# Patient Record
Sex: Male | Born: 1965 | Race: White | Hispanic: No | Marital: Single | Smoking: Current every day smoker
Health system: Southern US, Community
[De-identification: ages and names within clinical notes are randomized; demographics above are authoritative.]

## PROBLEM LIST (undated history)

## (undated) DIAGNOSIS — G4730 Sleep apnea, unspecified: Secondary | ICD-10-CM

## (undated) DIAGNOSIS — Z9689 Presence of other specified functional implants: Secondary | ICD-10-CM

## (undated) DIAGNOSIS — M1990 Unspecified osteoarthritis, unspecified site: Secondary | ICD-10-CM

## (undated) DIAGNOSIS — F32A Depression, unspecified: Secondary | ICD-10-CM

## (undated) DIAGNOSIS — M792 Neuralgia and neuritis, unspecified: Secondary | ICD-10-CM

## (undated) DIAGNOSIS — I10 Essential (primary) hypertension: Secondary | ICD-10-CM

## (undated) DIAGNOSIS — G8929 Other chronic pain: Secondary | ICD-10-CM

## (undated) DIAGNOSIS — E039 Hypothyroidism, unspecified: Secondary | ICD-10-CM

## (undated) DIAGNOSIS — R091 Pleurisy: Secondary | ICD-10-CM

## (undated) DIAGNOSIS — F419 Anxiety disorder, unspecified: Secondary | ICD-10-CM

## (undated) DIAGNOSIS — K219 Gastro-esophageal reflux disease without esophagitis: Secondary | ICD-10-CM

## (undated) DIAGNOSIS — E119 Type 2 diabetes mellitus without complications: Secondary | ICD-10-CM

## (undated) DIAGNOSIS — E785 Hyperlipidemia, unspecified: Secondary | ICD-10-CM

## (undated) DIAGNOSIS — Z972 Presence of dental prosthetic device (complete) (partial): Secondary | ICD-10-CM

## (undated) DIAGNOSIS — K08109 Complete loss of teeth, unspecified cause, unspecified class: Secondary | ICD-10-CM

## (undated) HISTORY — PX: KNEE ARTHROSCOPY: SHX127

---

## 1998-04-11 HISTORY — PX: UVULOPALATOPHARYNGOPLASTY: SHX827

## 2003-04-12 HISTORY — PX: ANKLE ARTHROPLASTY: SUR68

## 2003-04-12 HISTORY — PX: I&D EXTREMITY: SHX5045

## 2004-04-01 ENCOUNTER — Inpatient Hospital Stay (HOSPITAL_COMMUNITY): Admission: RE | Admit: 2004-04-01 | Discharge: 2004-04-03 | Payer: Self-pay | Admitting: Orthopedic Surgery

## 2006-04-11 HISTORY — PX: SPINAL CORD STIMULATOR IMPLANT: SHX2422

## 2013-04-11 HISTORY — PX: MULTIPLE TOOTH EXTRACTIONS: SHX2053

## 2014-03-31 ENCOUNTER — Other Ambulatory Visit: Payer: Self-pay | Admitting: Orthopedic Surgery

## 2014-04-09 ENCOUNTER — Encounter (HOSPITAL_BASED_OUTPATIENT_CLINIC_OR_DEPARTMENT_OTHER): Payer: Self-pay | Admitting: *Deleted

## 2014-04-09 NOTE — Progress Notes (Signed)
   04/09/14 0959  OBSTRUCTIVE SLEEP APNEA  Have you ever been diagnosed with sleep apnea through a sleep study? Yes  If yes, do you have and use a CPAP or BPAP machine every night? 0 (had corrective surgery)  Do you snore loudly (loud enough to be heard through closed doors)?  0  Do you often feel tired, fatigued, or sleepy during the daytime? 0  Has anyone observed you stop breathing during your sleep? 0  Do you have, or are you being treated for high blood pressure? 1  BMI more than 35 kg/m2? 1  Age over 46 years old? 0  Neck circumference greater than 40 cm/16 inches? 1  Gender: 1  Obstructive Sleep Apnea Score 4  Score 4 or greater  Results sent to PCP

## 2014-04-09 NOTE — Progress Notes (Signed)
Pt had corrective sleep apnea, denies snoring. Called for recent ekg, labs,chronic pain-to bring all meds,may need istat

## 2014-04-10 NOTE — H&P (Signed)
Adam [name] is an 48 y.o. male.   Chief Complaint:  Left knee Pain  HPI: Adam [name] is here today for followup on left knee pain.  This patient was last seen on 03/12/14 and that time he was diagnosed with internal derangement and the patient received a cortisone injection.  Patient is unable to have an MRI due to a spinal stimulator similar using this as a diagnostic injection.  The patient states that the injection did provide substantial immediate relief.  He did notice continued pressure in the knee.  The knee did fill week and loose.  He continues noticed cracking and grinding in the knee has locked up several times.  The cortisone taken and he had some good early for a couple days and then his symptoms returned.  He denies any new injury.  He denies any fevers chills night sweats or other signs of infection.  Past Medical History  Diagnosis Date  . Hypertension   . Diabetes mellitus without complication   . Sleep apnea     hx-had corrective surgery  . Arthritis   . GERD (gastroesophageal reflux disease)   . Hyperlipemia   . Hypothyroidism   . Chronic pain   . Peripheral neuropathic pain   . Full dentures   . Spinal cord stimulator status     Past Surgical History  Procedure Laterality Date  . Uvulopalatopharyngoplasty  2000  . Ankle arthroplasty  2005    right ankle injury  . I&d extremity  2005    right ankle  . Spinal cord stimulator implant  2008  . Spinal cord stimulator implant  2008    revision  . Knee arthroscopy      left x2  . Multiple tooth extractions  2015    History reviewed. No pertinent family history. Social History:  reports that he has been smoking.  He does not have any smokeless tobacco history on file. He reports that he drinks alcohol. He reports that he does not use illicit drugs.  Allergies: No Known Allergies  No prescriptions prior to admission    No results found for this or any previous visit (from the past 48 hour(s)). No results  found.  Review of Systems  Constitutional: Positive for malaise/fatigue.  HENT: Negative.   Eyes: Negative.   Cardiovascular: Positive for leg swelling.  Gastrointestinal: Positive for constipation.       Bowel changes  Musculoskeletal: Positive for myalgias and joint pain.  Skin: Negative.   Neurological: Negative.   Endo/Heme/Allergies: Negative.   Psychiatric/Behavioral: Negative.     Height 6\' 3"  (1.905 m), weight 151.501 kg (334 lb). Physical Exam  Constitutional: He is oriented to person, place, and time. He appears well-developed and well-nourished.  HENT:  Head: Normocephalic and atraumatic.  Eyes: Pupils are equal, round, and reactive to light.  Neck: Normal range of motion. Neck supple.  Cardiovascular: Intact distal pulses.   Respiratory: Effort normal.  Musculoskeletal:  He is alert and oriented x3.  His skin is intact.  Patient's respirations are regular and nonlabored.  Patient does have tenderness over the medial and lateral joint lines.  No noticeable effusion.  His calves are soft and nontender.  He is neurovascularly intact distally.  Neurological: He is alert and oriented to person, place, and time.  Skin: Skin is warm and dry.  Psychiatric: He has a normal mood and affect. His behavior is normal. Judgment and thought content normal.     Assessment/Plan Assess: Left knee pain with  concerns for degenerative meniscal tear  Plan: Treatment options are discussed with the patient.  Patient has elected to proceed with arthroscopic surgery.  He is made aware the benefits risks and potential complications of surgery.  He wishes to proceed with the knee scope.  A posting slip is completed and patient is to discuss scheduling with Agustin CreeKathy Blume.  We anticipate seeing the patient back at the time of surgery.  He is to return sooner if needed.  Call with any issues.  Anaja Monts R 04/10/2014, 1:42 PM

## 2014-04-14 ENCOUNTER — Ambulatory Visit (HOSPITAL_BASED_OUTPATIENT_CLINIC_OR_DEPARTMENT_OTHER)
Admission: RE | Admit: 2014-04-14 | Discharge: 2014-04-14 | Disposition: A | Discharge status: Home / Self Care | Payer: Medicare Other | Source: Ambulatory Visit | Attending: Orthopedic Surgery | Admitting: Orthopedic Surgery

## 2014-04-14 ENCOUNTER — Encounter (HOSPITAL_BASED_OUTPATIENT_CLINIC_OR_DEPARTMENT_OTHER)
Admission: RE | Disposition: A | Discharge status: Home / Self Care | Payer: Self-pay | Source: Ambulatory Visit | Attending: Orthopedic Surgery

## 2014-04-14 ENCOUNTER — Encounter (HOSPITAL_BASED_OUTPATIENT_CLINIC_OR_DEPARTMENT_OTHER): Payer: Self-pay | Admitting: Anesthesiology

## 2014-04-14 ENCOUNTER — Ambulatory Visit (HOSPITAL_BASED_OUTPATIENT_CLINIC_OR_DEPARTMENT_OTHER): Payer: Medicare Other | Admitting: Anesthesiology

## 2014-04-14 DIAGNOSIS — M2242 Chondromalacia patellae, left knee: Secondary | ICD-10-CM | POA: Diagnosis not present

## 2014-04-14 DIAGNOSIS — F17210 Nicotine dependence, cigarettes, uncomplicated: Secondary | ICD-10-CM | POA: Insufficient documentation

## 2014-04-14 DIAGNOSIS — M1990 Unspecified osteoarthritis, unspecified site: Secondary | ICD-10-CM | POA: Diagnosis not present

## 2014-04-14 DIAGNOSIS — S83242A Other tear of medial meniscus, current injury, left knee, initial encounter: Secondary | ICD-10-CM

## 2014-04-14 DIAGNOSIS — I10 Essential (primary) hypertension: Secondary | ICD-10-CM | POA: Diagnosis not present

## 2014-04-14 DIAGNOSIS — K219 Gastro-esophageal reflux disease without esophagitis: Secondary | ICD-10-CM | POA: Diagnosis not present

## 2014-04-14 DIAGNOSIS — M2342 Loose body in knee, left knee: Secondary | ICD-10-CM | POA: Diagnosis not present

## 2014-04-14 DIAGNOSIS — S83282A Other tear of lateral meniscus, current injury, left knee, initial encounter: Secondary | ICD-10-CM

## 2014-04-14 DIAGNOSIS — S83272A Complex tear of lateral meniscus, current injury, left knee, initial encounter: Secondary | ICD-10-CM | POA: Diagnosis not present

## 2014-04-14 DIAGNOSIS — E039 Hypothyroidism, unspecified: Secondary | ICD-10-CM | POA: Insufficient documentation

## 2014-04-14 DIAGNOSIS — E785 Hyperlipidemia, unspecified: Secondary | ICD-10-CM | POA: Insufficient documentation

## 2014-04-14 DIAGNOSIS — E119 Type 2 diabetes mellitus without complications: Secondary | ICD-10-CM | POA: Diagnosis not present

## 2014-04-14 DIAGNOSIS — G4730 Sleep apnea, unspecified: Secondary | ICD-10-CM | POA: Insufficient documentation

## 2014-04-14 HISTORY — DX: Hyperlipidemia, unspecified: E78.5

## 2014-04-14 HISTORY — PX: KNEE ARTHROSCOPY: SHX127

## 2014-04-14 HISTORY — DX: Unspecified osteoarthritis, unspecified site: M19.90

## 2014-04-14 HISTORY — PX: KNEE ARTHROSCOPY WITH MEDIAL MENISECTOMY: SHX5651

## 2014-04-14 HISTORY — DX: Essential (primary) hypertension: I10

## 2014-04-14 HISTORY — DX: Presence of other specified functional implants: Z96.89

## 2014-04-14 HISTORY — DX: Neuralgia and neuritis, unspecified: M79.2

## 2014-04-14 HISTORY — DX: Sleep apnea, unspecified: G47.30

## 2014-04-14 HISTORY — PX: CHONDROPLASTY: SHX5177

## 2014-04-14 HISTORY — DX: Hypothyroidism, unspecified: E03.9

## 2014-04-14 HISTORY — PX: KNEE ARTHROSCOPY WITH LATERAL MENISECTOMY: SHX6193

## 2014-04-14 HISTORY — DX: Type 2 diabetes mellitus without complications: E11.9

## 2014-04-14 HISTORY — DX: Other chronic pain: G89.29

## 2014-04-14 HISTORY — DX: Gastro-esophageal reflux disease without esophagitis: K21.9

## 2014-04-14 HISTORY — DX: Presence of dental prosthetic device (complete) (partial): Z97.2

## 2014-04-14 HISTORY — DX: Complete loss of teeth, unspecified cause, unspecified class: K08.109

## 2014-04-14 LAB — GLUCOSE, CAPILLARY
GLUCOSE-CAPILLARY: 109 mg/dL — AB (ref 70–99)
Glucose-Capillary: 107 mg/dL — ABNORMAL HIGH (ref 70–99)

## 2014-04-14 LAB — POCT HEMOGLOBIN-HEMACUE: Hemoglobin: 14.9 g/dL (ref 13.0–17.0)

## 2014-04-14 SURGERY — ARTHROSCOPY, KNEE
Anesthesia: General | Site: Knee | Laterality: Left

## 2014-04-14 MED ORDER — DEXAMETHASONE SODIUM PHOSPHATE 4 MG/ML IJ SOLN: INTRAMUSCULAR | Status: DC | PRN

## 2014-04-14 MED ORDER — FENTANYL CITRATE 0.05 MG/ML IJ SOLN: 50.0000 ug | INTRAMUSCULAR | Status: DC | PRN

## 2014-04-14 MED ORDER — FENTANYL CITRATE 0.05 MG/ML IJ SOLN
INTRAMUSCULAR | Status: AC
  Filled 2014-04-14: qty 2

## 2014-04-14 MED ORDER — EPINEPHRINE HCL 1 MG/ML IJ SOLN
INTRAMUSCULAR | Status: AC
  Filled 2014-04-14: qty 1

## 2014-04-14 MED ORDER — FENTANYL CITRATE 0.05 MG/ML IJ SOLN
INTRAMUSCULAR | Status: DC | PRN
  Administered 2014-04-14 (×2): 50 ug via INTRAVENOUS
  Administered 2014-04-14: 100 ug via INTRAVENOUS

## 2014-04-14 MED ORDER — CEFAZOLIN SODIUM-DEXTROSE 2-3 GM-% IV SOLR
2.0000 g | INTRAVENOUS | Status: AC
  Administered 2014-04-14: 3 g via INTRAVENOUS

## 2014-04-14 MED ORDER — EPHEDRINE SULFATE 50 MG/ML IJ SOLN
INTRAMUSCULAR | Status: DC | PRN
  Administered 2014-04-14 (×2): 10 mg via INTRAVENOUS

## 2014-04-14 MED ORDER — DEXAMETHASONE SODIUM PHOSPHATE 4 MG/ML IJ SOLN
INTRAMUSCULAR | Status: DC | PRN
  Administered 2014-04-14: 5 mg via INTRAVENOUS

## 2014-04-14 MED ORDER — SODIUM CHLORIDE 0.9 % IR SOLN
Status: DC | PRN
  Administered 2014-04-14: 6000 mL

## 2014-04-14 MED ORDER — HYDROMORPHONE HCL 1 MG/ML IJ SOLN
INTRAMUSCULAR | Status: AC
  Filled 2014-04-14: qty 1

## 2014-04-14 MED ORDER — CHLORHEXIDINE GLUCONATE 4 % EX LIQD: 60.0000 mL | Freq: Once | CUTANEOUS | Status: DC

## 2014-04-14 MED ORDER — MIDAZOLAM HCL 2 MG/2ML IJ SOLN: 1.0000 mg | INTRAMUSCULAR | Status: DC | PRN

## 2014-04-14 MED ORDER — OXYCODONE HCL 5 MG PO TABS
5.0000 mg | ORAL_TABLET | Freq: Once | ORAL | Status: AC | PRN
  Administered 2014-04-14: 5 mg via ORAL

## 2014-04-14 MED ORDER — LIDOCAINE HCL (PF) 1 % IJ SOLN
INTRAMUSCULAR | Status: AC
  Filled 2014-04-14: qty 30

## 2014-04-14 MED ORDER — MIDAZOLAM HCL 2 MG/2ML IJ SOLN
INTRAMUSCULAR | Status: AC
  Filled 2014-04-14: qty 2

## 2014-04-14 MED ORDER — CEFAZOLIN SODIUM-DEXTROSE 2-3 GM-% IV SOLR
INTRAVENOUS | Status: AC
  Filled 2014-04-14: qty 50

## 2014-04-14 MED ORDER — FENTANYL CITRATE 0.05 MG/ML IJ SOLN
INTRAMUSCULAR | Status: AC
  Filled 2014-04-14: qty 4

## 2014-04-14 MED ORDER — CEFAZOLIN SODIUM 1-5 GM-% IV SOLN
INTRAVENOUS | Status: AC
  Filled 2014-04-14: qty 50

## 2014-04-14 MED ORDER — BUPIVACAINE-EPINEPHRINE (PF) 0.5% -1:200000 IJ SOLN
INTRAMUSCULAR | Status: DC | PRN
  Administered 2014-04-14: 30 mL via PERINEURAL

## 2014-04-14 MED ORDER — PROMETHAZINE HCL 25 MG/ML IJ SOLN
6.2500 mg | INTRAMUSCULAR | Status: DC | PRN
Start: 2014-04-14 — End: 2014-04-14

## 2014-04-14 MED ORDER — OXYCODONE HCL 5 MG PO TABS
ORAL_TABLET | ORAL | Status: AC
  Filled 2014-04-14: qty 1

## 2014-04-14 MED ORDER — BUPIVACAINE HCL (PF) 0.5 % IJ SOLN
INTRAMUSCULAR | Status: AC
  Filled 2014-04-14: qty 30

## 2014-04-14 MED ORDER — EPINEPHRINE HCL 1 MG/ML IJ SOLN
INTRAMUSCULAR | Status: DC | PRN
  Administered 2014-04-14: 2 mL

## 2014-04-14 MED ORDER — HYDROMORPHONE HCL 1 MG/ML IJ SOLN
0.2500 mg | INTRAMUSCULAR | Status: DC | PRN
  Administered 2014-04-14 (×2): 0.5 mg via INTRAVENOUS

## 2014-04-14 MED ORDER — LACTATED RINGERS IV SOLN
INTRAVENOUS | Status: DC
  Administered 2014-04-14 (×2): via INTRAVENOUS

## 2014-04-14 MED ORDER — DEXTROSE-NACL 5-0.45 % IV SOLN: INTRAVENOUS | Status: DC

## 2014-04-14 MED ORDER — OXYCODONE HCL 5 MG/5ML PO SOLN: 5.0000 mg | Freq: Once | ORAL | Status: AC | PRN

## 2014-04-14 MED ORDER — MIDAZOLAM HCL 5 MG/5ML IJ SOLN
INTRAMUSCULAR | Status: DC | PRN
  Administered 2014-04-14: 2 mg via INTRAVENOUS

## 2014-04-14 MED ORDER — BUPIVACAINE-EPINEPHRINE (PF) 0.5% -1:200000 IJ SOLN
INTRAMUSCULAR | Status: AC
  Filled 2014-04-14: qty 30

## 2014-04-14 MED ORDER — ONDANSETRON HCL 4 MG/2ML IJ SOLN
INTRAMUSCULAR | Status: DC | PRN
  Administered 2014-04-14: 4 mg via INTRAVENOUS

## 2014-04-14 SURGICAL SUPPLY — 41 items
BANDAGE ELASTIC 6 VELCRO ST LF (GAUZE/BANDAGES/DRESSINGS) ×3 IMPLANT
BLADE 4.2CUDA (BLADE) IMPLANT
BLADE CUTTER GATOR 3.5 (BLADE) ×3 IMPLANT
BLADE GREAT WHITE 4.2 (BLADE) ×2 IMPLANT
BLADE GREAT WHITE 4.2MM (BLADE) ×1
BNDG COHESIVE 6X5 TAN STRL LF (GAUZE/BANDAGES/DRESSINGS) ×3 IMPLANT
CANISTER SUCT 3000ML (MISCELLANEOUS) IMPLANT
DRAPE ARTHROSCOPY W/POUCH 114 (DRAPES) ×3 IMPLANT
DURAPREP 26ML APPLICATOR (WOUND CARE) ×3 IMPLANT
ELECT MENISCUS 165MM 90D (ELECTRODE) IMPLANT
ELECT REM PT RETURN 9FT ADLT (ELECTROSURGICAL)
ELECTRODE REM PT RTRN 9FT ADLT (ELECTROSURGICAL) IMPLANT
GAUZE SPONGE 4X4 12PLY STRL (GAUZE/BANDAGES/DRESSINGS) ×3 IMPLANT
GAUZE XEROFORM 1X8 LF (GAUZE/BANDAGES/DRESSINGS) ×3 IMPLANT
GLOVE BIO SURGEON STRL SZ7.5 (GLOVE) ×3 IMPLANT
GLOVE BIO SURGEON STRL SZ8.5 (GLOVE) ×3 IMPLANT
GLOVE BIOGEL PI IND STRL 8 (GLOVE) ×1 IMPLANT
GLOVE BIOGEL PI IND STRL 9 (GLOVE) ×1 IMPLANT
GLOVE BIOGEL PI INDICATOR 8 (GLOVE) ×2
GLOVE BIOGEL PI INDICATOR 9 (GLOVE) ×2
GOWN STRL REUS W/ TWL LRG LVL3 (GOWN DISPOSABLE) ×1 IMPLANT
GOWN STRL REUS W/ TWL XL LVL3 (GOWN DISPOSABLE) ×2 IMPLANT
GOWN STRL REUS W/TWL LRG LVL3 (GOWN DISPOSABLE) ×2
GOWN STRL REUS W/TWL XL LVL3 (GOWN DISPOSABLE) ×4
IV NS IRRIG 3000ML ARTHROMATIC (IV SOLUTION) ×6 IMPLANT
KNEE WRAP E Z 3 GEL PACK (MISCELLANEOUS) ×3 IMPLANT
MANIFOLD NEPTUNE II (INSTRUMENTS) IMPLANT
NDL SAFETY ECLIPSE 18X1.5 (NEEDLE) ×1 IMPLANT
NEEDLE HYPO 18GX1.5 SHARP (NEEDLE) ×2
PACK ARTHROSCOPY DSU (CUSTOM PROCEDURE TRAY) ×3 IMPLANT
PACK BASIN DAY SURGERY FS (CUSTOM PROCEDURE TRAY) ×3 IMPLANT
PAD ALCOHOL SWAB (MISCELLANEOUS) ×3 IMPLANT
PENCIL BUTTON HOLSTER BLD 10FT (ELECTRODE) IMPLANT
SET ARTHROSCOPY TUBING (MISCELLANEOUS) ×2
SET ARTHROSCOPY TUBING LN (MISCELLANEOUS) ×1 IMPLANT
SLEEVE SCD COMPRESS KNEE MED (MISCELLANEOUS) ×3 IMPLANT
SYR 3ML 18GX1 1/2 (SYRINGE) IMPLANT
SYR 5ML LL (SYRINGE) ×3 IMPLANT
TOWEL OR 17X24 6PK STRL BLUE (TOWEL DISPOSABLE) ×3 IMPLANT
WAND STAR VAC 90 (SURGICAL WAND) IMPLANT
WATER STERILE IRR 1000ML POUR (IV SOLUTION) ×3 IMPLANT

## 2014-04-14 NOTE — Discharge Instructions (Signed)
Arthroscopic Procedure, Knee °An arthroscopic procedure can find what is wrong with your knee. °PROCEDURE °Arthroscopy is a surgical technique that allows your orthopedic surgeon to diagnose and treat your knee injury with accuracy. They will look into your knee through a small instrument. This is almost like a small (pencil sized) telescope. Because arthroscopy affects your knee less than open knee surgery, you can anticipate a more rapid recovery. Taking an active role by following your caregiver's instructions will help with rapid and complete recovery. Use crutches, rest, elevation, ice, and knee exercises as instructed. The length of recovery depends on various factors including type of injury, age, physical condition, medical conditions, and your rehabilitation. °Your knee is the joint between the large bones (femur and tibia) in your leg. Cartilage covers these bone ends which are smooth and slippery and allow your knee to bend and move smoothly. Two menisci, thick, semi-lunar shaped pads of cartilage which form a rim inside the joint, help absorb shock and stabilize your knee. Ligaments bind the bones together and support your knee joint. Muscles move the joint, help support your knee, and take stress off the joint itself. Because of this all programs and physical therapy to rehabilitate an injured or repaired knee require rebuilding and strengthening your muscles. °AFTER THE PROCEDURE °· After the procedure, you will be moved to a recovery area until most of the effects of the medication have worn off. Your caregiver will discuss the test results with you. °· Only take over-the-counter or prescription medicines for pain, discomfort, or fever as directed by your caregiver. °SEEK MEDICAL CARE IF:  °· You have increased bleeding from your wounds. °· You see redness, swelling, or have increasing pain in your wounds. °· You have pus coming from your wound. °· You have an oral temperature above 102° F (38.9°  C). °· You notice a bad smell coming from the wound or dressing. °· You have severe pain with any motion of your knee. °SEEK IMMEDIATE MEDICAL CARE IF:  °· You develop a rash. °· You have difficulty breathing. °· You have any allergic problems. °Document Released: 03/25/2000 Document Revised: 06/20/2011 Document Reviewed: 10/17/2007 °[employer]® Patient Information ©2015 [employer]. This information is not intended to replace advice given to you by your health care provider. Make sure you discuss any questions you have with your health care provider. ° ° ° °Post Anesthesia Home Care Instructions ° °Activity: °Get plenty of rest for the remainder of the day. A responsible adult should stay with you for 24 hours following the procedure.  °For the next 24 hours, DO NOT: °-Drive a car °-Operate machinery °-Drink alcoholic beverages °-Take any medication unless instructed by your physician °-Make any legal decisions or sign important papers. ° °Meals: °Start with liquid foods such as gelatin or soup. Progress to regular foods as tolerated. Avoid greasy, spicy, heavy foods. If nausea and/or vomiting occur, drink only clear liquids until the nausea and/or vomiting subsides. Call your physician if vomiting continues. ° °Special Instructions/Symptoms: °Your throat may feel dry or sore from the anesthesia or the breathing tube placed in your throat during surgery. If this causes discomfort, gargle with warm salt water. The discomfort should disappear within 24 hours. ° ° °Call your surgeon if you experience:  ° °1.  Fever over 101.0. °2.  Inability to urinate. °3.  Nausea and/or vomiting. °4.  Extreme swelling or bruising at the surgical site. °5.  Continued bleeding from the incision. °6.  Increased pain, redness or drainage   from the incision. °7.  Problems related to your pain medication. °8. Any change in color, movement and/or sensation °9. Any problems and/or concerns ° ° ° °

## 2014-04-14 NOTE — Anesthesia Procedure Notes (Signed)
Procedure Name: LMA Insertion Date/Time: 04/14/2014 9:19 AM Performed by: Burna Cash Pre-anesthesia Checklist: Patient identified, Emergency Drugs available, Suction available and Patient being monitored Patient Re-evaluated:Patient Re-evaluated prior to inductionOxygen Delivery Method: Circle System Utilized Preoxygenation: Pre-oxygenation with 100% oxygen Intubation Type: IV induction Ventilation: Mask ventilation without difficulty LMA: LMA inserted LMA Size: 5.0 Number of attempts: 1 Airway Equipment and Method: bite block Placement Confirmation: positive ETCO2 Tube secured with: Tape Dental Injury: Teeth and Oropharynx as per pre-operative assessment

## 2014-04-14 NOTE — Interval H&P Note (Signed)
History and Physical Interval Note:  04/14/2014 9:06 AM  Adam [name]  has presented today for surgery, with the diagnosis of LEFT KNEE MENISCAL TEAR   The various methods of treatment have been discussed with the patient and family. After consideration of risks, benefits and other options for treatment, the patient has consented to  Procedure(s): LEFT KNEE ARTHROSCOPY (Left) as a surgical intervention .  The patient's history has been reviewed, patient examined, no change in status, stable for surgery.  I have reviewed the patient's chart and labs.  Questions were answered to the patient's satisfaction.     Nestor Lewandowsky

## 2014-04-14 NOTE — Transfer of Care (Signed)
Immediate Anesthesia Transfer of Care Note  Patient: Adam [name]  Procedure(s) Performed: Procedure(s): LEFT KNEE ARTHROSCOPY with debridement of medial and lateral meniscus,  (Left)  Patient Location: PACU  Anesthesia Type:General  Level of Consciousness: sedated  Airway & Oxygen Therapy: Patient Spontanous Breathing and Patient connected to face mask oxygen  Post-op Assessment: Report given to PACU RN and Post -op Vital signs reviewed and stable  Post vital signs: Reviewed and stable  Complications: No apparent anesthesia complications

## 2014-04-14 NOTE — Anesthesia Preprocedure Evaluation (Signed)
Anesthesia Evaluation  Patient identified by MRN, date of birth, ID band Patient awake    Reviewed: Allergy & Precautions, H&P , NPO status , Patient's Chart, lab work & pertinent test results  Airway Mallampati: II  TM Distance: >3 FB Neck ROM: full    Dental  (+) Edentulous Upper, Edentulous Lower, Dental Advidsory Given   Pulmonary sleep apnea , Current Smoker,  breath sounds clear to auscultation        Cardiovascular hypertension, negative cardio ROS  Rhythm:regular Rate:Normal     Neuro/Psych negative neurological ROS  negative psych ROS   GI/Hepatic Neg liver ROS, GERD-  Medicated,  Endo/Other  diabetesHypothyroidism   Renal/GU negative Renal ROS     Musculoskeletal   Abdominal   Peds  Hematology   Anesthesia Other Findings   Reproductive/Obstetrics negative OB ROS                             Anesthesia Physical Anesthesia Plan  ASA: III  Anesthesia Plan: General LMA   Post-op Pain Management:    Induction:   Airway Management Planned:   Additional Equipment:   Intra-op Plan:   Post-operative Plan:   Informed Consent: I have reviewed the patients History and Physical, chart, labs and discussed the procedure including the risks, benefits and alternatives for the proposed anesthesia with the patient or authorized representative who has indicated his/her understanding and acceptance.   Dental Advisory Given  Plan Discussed with: Anesthesiologist, CRNA and Surgeon  Anesthesia Plan Comments:         Anesthesia Quick Evaluation

## 2014-04-14 NOTE — Op Note (Signed)
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 Turner Daniels M.D.  Assist: Tomi Likens. Gaylene Brooks  (present throughout entire procedure and necessary for timely completion of the procedure) Anes: General LMA  EBL: Minimal  Fluids: 800 cc   Indications: Patient on chronic pain management with catching popping and pain in his left knee. Because of an implanted spinal cord stimulator he cannot tolerate an MRI scan. The pain is positional and hurts worse when he is flexing and rotating his knee. Pt has failed conservative treatment with anti-inflammatory medicines, physical therapy, and modified activites but did get good temporarily from an intra-articular cortisone injection. Pain has recurred and patient desires elective arthroscopic evaluation and treatment of knee. Risks and benefits of surgery have been discussed and questions answered.  Procedure: Patient identified by arm band and taken to the operating room at the day surgery Center. The appropriate anesthetic monitors were attached, and General LMA anesthesia was induced without difficulty. Lateral post was applied to the table and the lower extremity was prepped and draped in usual sterile fashion from the ankle to the midthigh. Time out procedure was performed. We began the operation by making standard inferior lateral and inferior medial peripatellar portals with a #11 blade allowing introduction of the arthroscope through the inferior lateral portal and the out flow to the inferior medial portal. Pump pressure was set at 100 mmHg and diagnostic arthroscopy  revealed chondromalacia lateral facet of the  patella grade 3 debrided back to a stable margin with a 3.5 mm Gator sucker shaver. Medial compartment as small radial tear of the posterior medial horn of the medial meniscus likewise debrided. In the notch we encountered a 6 mm osteochondral loose body that was removed with a barrel grasper. On the lateral side the patient had extensive complex tearing of the posterior horn and posterior lateral horns of the lateral meniscus likewise debrided with a straight biter a upbiter 4.2 gray-white sucker shaver and a 3.5 Gator sucker shaver back to a stable margin. There is grade 3 chondromalacia lateral tibial plateau likewise debrided.. The knee was irrigated out normal saline solution. A dressing of xerofoam 4 x 4 dressing sponges, web roll and an Ace wrap was applied. The patient was awakened extubated and taken to the recovery without difficulty.    Signed: Nestor Lewandowsky, MD

## 2014-04-14 NOTE — Anesthesia Postprocedure Evaluation (Signed)
Anesthesia Post Note  Patient: Adam [name]  Procedure(s) Performed: Procedure(s) (LRB): LEFT KNEE ARTHROSCOPY removal loose body (Left) KNEE ARTHROSCOPY WITH MEDIAL MENISECTOMY (Left) KNEE ARTHROSCOPY WITH LATERAL MENISECTOMY (Left) CHONDROPLASTY Patella and tibial condyle (Left)  Anesthesia type: general  Patient location: PACU  Post pain: Pain level controlled  Post assessment: Patient's Cardiovascular Status Stable  Last Vitals:  Filed Vitals:   04/14/14 1100  BP: 123/72  Pulse: 79  Temp: 36.5 C  Resp: 18    Post vital signs: Reviewed and stable  Level of consciousness: sedated  Complications: No apparent anesthesia complications

## 2014-04-15 ENCOUNTER — Encounter (HOSPITAL_BASED_OUTPATIENT_CLINIC_OR_DEPARTMENT_OTHER): Payer: Self-pay | Admitting: Orthopedic Surgery

## 2015-07-13 ENCOUNTER — Encounter: Payer: Self-pay | Admitting: Hematology and Oncology

## 2015-07-13 DIAGNOSIS — I82452 Acute embolism and thrombosis of left peroneal vein: Secondary | ICD-10-CM | POA: Insufficient documentation

## 2015-07-14 ENCOUNTER — Encounter: Payer: Self-pay | Admitting: Hematology and Oncology

## 2015-07-15 ENCOUNTER — Telehealth: Payer: Self-pay | Admitting: Hematology and Oncology

## 2015-07-15 NOTE — Telephone Encounter (Signed)
returned call and s.w. pt and confirmed appt °

## 2015-07-21 ENCOUNTER — Ambulatory Visit: Payer: Medicare Other | Admitting: Hematology and Oncology

## 2015-07-28 ENCOUNTER — Ambulatory Visit (HOSPITAL_BASED_OUTPATIENT_CLINIC_OR_DEPARTMENT_OTHER): Payer: Medicare Other | Admitting: Hematology and Oncology

## 2015-07-28 ENCOUNTER — Other Ambulatory Visit: Payer: Self-pay | Admitting: Hematology and Oncology

## 2015-07-28 ENCOUNTER — Telehealth: Payer: Self-pay | Admitting: Hematology and Oncology

## 2015-07-28 ENCOUNTER — Encounter: Payer: Self-pay | Admitting: Hematology and Oncology

## 2015-07-28 VITALS — BP 118/61 | HR 74 | Temp 98.2°F | Resp 18 | Ht 75.0 in | Wt 350.0 lb

## 2015-07-28 DIAGNOSIS — I8002 Phlebitis and thrombophlebitis of superficial vessels of left lower extremity: Secondary | ICD-10-CM | POA: Diagnosis not present

## 2015-07-28 DIAGNOSIS — M545 Low back pain: Secondary | ICD-10-CM | POA: Diagnosis not present

## 2015-07-28 DIAGNOSIS — Z720 Tobacco use: Secondary | ICD-10-CM

## 2015-07-28 DIAGNOSIS — Z716 Tobacco abuse counseling: Secondary | ICD-10-CM

## 2015-07-28 DIAGNOSIS — Z599 Problem related to housing and economic circumstances, unspecified: Secondary | ICD-10-CM | POA: Insufficient documentation

## 2015-07-28 DIAGNOSIS — G8929 Other chronic pain: Secondary | ICD-10-CM

## 2015-07-28 DIAGNOSIS — Z598 Other problems related to housing and economic circumstances: Secondary | ICD-10-CM

## 2015-07-28 DIAGNOSIS — Z832 Family history of diseases of the blood and blood-forming organs and certain disorders involving the immune mechanism: Secondary | ICD-10-CM

## 2015-07-28 DIAGNOSIS — I824Z9 Acute embolism and thrombosis of unspecified deep veins of unspecified distal lower extremity: Secondary | ICD-10-CM

## 2015-07-28 DIAGNOSIS — I82452 Acute embolism and thrombosis of left peroneal vein: Secondary | ICD-10-CM

## 2015-07-28 MED ORDER — RIVAROXABAN 20 MG PO TABS: 20.0000 mg | ORAL_TABLET | Freq: Every day | ORAL | Status: DC

## 2015-07-28 NOTE — Assessment & Plan Note (Signed)
I spent some time counseling the patient the importance of tobacco cessation. he is currently attempting to quit on his own 

## 2015-07-28 NOTE — Progress Notes (Signed)
[facility] CONSULT NOTE  Patient Care Team: Jarrett Soho, PA-C as PCP - General (Family Medicine) Rhona Raider, DO as Consulting Physician (Anesthesiology)  CHIEF COMPLAINTS/PURPOSE OF CONSULTATION:  Left peroneal vein DVT  HISTORY OF PRESENTING ILLNESS:  Adam [name] 50 y.o. male is here because of recent diagnosis of DVT. This patient is morbidly obese with reduced mobility due to chronic pain related to old injuries and is a smoker. The patient has acute onset of left calf pain around 06/24/2015. He woke up with left leg pain. Immediately the day after, he developed significant left leg swelling. He subsequently saw his pain specialist on 06/26/2015 and was directed to the local emergency department. I reviewed the electronic records which showed that the patient has acute DVT involving the peroneal vein. He was given a starter pack with our Eliquis and was discharged to next day. His calf pain resolved within several days. Since then, he has chronic achiness around the left calf region with intermittent swelling.  He denies recent chest pain on exertion, shortness of breath on minimal exertion, pre-syncopal episodes, hemoptysis, or palpitation. He denies recent history of trauma, long distance travel, dehydration or recent surgery. The patient had chronic smoking history and typically is not very mobile. His mobility is limited by his chronic back pain and knee pain, morbid obesity and chronic injuries. Usually, he would have 4-5 active days and several days of bed rest at home. He had no prior history or diagnosis of cancer. His age appropriate screening programs are up-to-date. He had prior surgeries before and never had perioperative thromboembolic events. The patient had never been exposed to testosterone replacement therapy. His mother who is deceased had history of warfarin use although of unknown circumstances. She developed significant complication related to  warfarin therapy with retroperitoneal hematoma and subsequently died from unrelated reason. The patient denies any recent signs or symptoms of bleeding such as spontaneous epistaxis, hematuria or hematochezia.   MEDICAL HISTORY:  Past Medical History  Diagnosis Date  . Hypertension   . Diabetes mellitus without complication (HCC)   . Sleep apnea     hx-had corrective surgery  . Arthritis   . GERD (gastroesophageal reflux disease)   . Hyperlipemia   . Hypothyroidism   . Chronic pain   . Peripheral neuropathic pain (HCC)   . Full dentures   . Spinal cord stimulator status     SURGICAL HISTORY: Past Surgical History  Procedure Laterality Date  . Uvulopalatopharyngoplasty  2000  . Ankle arthroplasty  2005    right ankle injury  . I&d extremity  2005    right ankle  . Spinal cord stimulator implant  2008  . Spinal cord stimulator implant  2008    revision  . Knee arthroscopy      left x2  . Multiple tooth extractions  2015  . Knee arthroscopy Left 04/14/2014    Procedure: LEFT KNEE ARTHROSCOPY removal loose body;  Surgeon: Nestor Lewandowsky, MD;  Location: [facility];  Service: Orthopedics;  Laterality: Left;  . Knee arthroscopy with medial menisectomy Left 04/14/2014    Procedure: KNEE ARTHROSCOPY WITH MEDIAL MENISECTOMY;  Surgeon: Nestor Lewandowsky, MD;  Location: [facility];  Service: Orthopedics;  Laterality: Left;  . Knee arthroscopy with lateral menisectomy Left 04/14/2014    Procedure: KNEE ARTHROSCOPY WITH LATERAL MENISECTOMY;  Surgeon: Nestor Lewandowsky, MD;  Location: [facility];  Service: Orthopedics;  Laterality: Left;  . Chondroplasty  Left 04/14/2014    Procedure: CHONDROPLASTY Patella and tibial condyle;  Surgeon: Nestor Lewandowsky, MD;  Location: [facility];  Service: Orthopedics;  Laterality: Left;    SOCIAL HISTORY: Social History   Social History  . Marital Status: Single    Spouse Name: N/A  . Number of Children: N/A   . Years of Education: N/A   Occupational History  . Not on file.   Social History Main Topics  . Smoking status: Current Every Day Smoker -- 1.00 packs/day  . Smokeless tobacco: Never Used  . Alcohol Use: Yes     Comment: rare  . Drug Use: No  . Sexual Activity: Not on file   Other Topics Concern  . Not on file   Social History Narrative    FAMILY HISTORY: Family History  Problem Relation Age of Onset  . Clotting disorder Mother     ALLERGIES:  has No Known Allergies.  MEDICATIONS:  Current Outpatient Prescriptions  Medication Sig Dispense Refill  . apixaban (ELIQUIS) 5 MG TABS tablet Take 5 mg by mouth 2 (two) times daily.    . clonazePAM (KLONOPIN) 0.5 MG tablet Take 0.5 mg by mouth 2 (two) times daily as needed.    Marland Kitchen levothyroxine (SYNTHROID, LEVOTHROID) 100 MCG tablet Take 100 mcg by mouth daily before breakfast.    . lisinopril-hydrochlorothiazide (PRINZIDE,ZESTORETIC) 20-25 MG per tablet Take 1 tablet by mouth daily.    . metFORMIN (GLUCOPHAGE) 500 MG tablet Take 500 mg by mouth 2 (two) times daily.    Marland Kitchen morphine (MS CONTIN) 30 MG 12 hr tablet Take 30 mg by mouth 3 (three) times daily.    Marland Kitchen omeprazole (PRILOSEC) 40 MG capsule Take 40 mg by mouth daily.  1  . oxycodone (ROXICODONE) 30 MG immediate release tablet Take 30 mg by mouth 2 (two) times daily as needed.  0  . pravastatin (PRAVACHOL) 40 MG tablet Take 40 mg by mouth daily.    Marland Kitchen zolpidem (AMBIEN) 10 MG tablet Take 10 mg by mouth at bedtime as needed for sleep.    . rivaroxaban (XARELTO) 20 MG TABS tablet Take 1 tablet (20 mg total) by mouth daily with supper. 30 tablet 3   No current facility-administered medications for this visit.    REVIEW OF SYSTEMS:   Constitutional: Denies fevers, chills or abnormal night sweats Eyes: Denies blurriness of vision, double vision or watery eyes Ears, nose, mouth, throat, and face: Denies mucositis or sore throat Respiratory: Denies cough, dyspnea or  wheezes Cardiovascular: Denies palpitation, chest discomfort  Gastrointestinal:  Denies nausea, heartburn or change in bowel habits Skin: Denies abnormal skin rashes Lymphatics: Denies new lymphadenopathy or easy bruising Neurological:Denies numbness, tingling or new weaknesses Behavioral/Psych: Mood is stable, no new changes  All other systems were reviewed with the patient and are negative.  PHYSICAL EXAMINATION: ECOG PERFORMANCE STATUS: 2 - Symptomatic, <50% confined to bed  Filed Vitals:   07/28/15 1118  BP: 118/61  Pulse: 74  Temp: 98.2 F (36.8 C)  Resp: 18   Filed Weights   07/28/15 1118  Weight: 350 lb (158.759 kg)    GENERAL:alert, no distress and comfortable. He is morbidly obese  SKIN: skin color, texture, turgor are normal, no rashes or significant lesions EYES: normal, conjunctiva are pink and non-injected, sclera clear OROPHARYNX:no exudate, no erythema and lips, buccal mucosa, and tongue normal  NECK: supple, thyroid normal size, non-tender, without nodularity LYMPH:  no palpable lymphadenopathy in the cervical, axillary or inguinal LUNGS:  clear to auscultation and percussion with normal breathing effort HEART: regular rate & rhythm and no murmur with mild bilateral lower extremity edema and evidence of superficial thrombophlebitis on the left lower extremity.  ABDOMEN:abdomen soft, non-tender and normal bowel sounds Musculoskeletal:no cyanosis of digits and no clubbing  PSYCH: alert & oriented x 3 with fluent speech NEURO: no focal motor/sensory deficits  LABORATORY DATA:  I have reviewed the data as listed in referring note. Will get further lab results from his recent hypercoagulation panel  ASSESSMENT & PLAN:   Left peroneal vein thrombosis (HCC) I reviewed with the patient about the plan for care for left lower extremity distal DVT.  This last episode of blood clot appeared to be provoked by smoking and chronic immobility. I do not see a reason to  order excessive testing to screen for thrombophilia disorder as it would not change our management.  The goal of anticoagulation therapy is for 3 months, possibly longer if the patient continues to smoke.  We discussed about various options of anticoagulation therapies including warfarin, low molecular weight heparin such as Lovenox or newer agents such as Rivaroxaban. Some of the risks and benefits discussed including costs involved, the need for monitoring, risks of life-threatening bleeding/hospitalization, reversibility of each agent in the event of bleeding or overdose, safety profile of each drug and taking into account other social issues such as ease of administration of medications, etc. Ultimately, we have made an informed decision for the patient to continue his treatment with Eliquis, to be switched over to Xarelto  I recommend the patient to use elastic compression stockings at 20-30 mmHg to reduce risks of chronic thrombophlebitis.  Finally, at the end of our consultation today, I reinforced the importance of preventive strategies such as avoiding hormonal supplement, avoiding cigarette smoking, keeping up-to-date with screening programs for early cancer detection, frequent ambulation for long distance travel and aggressive DVT prophylaxis in all surgical settings.  I plan to see him back in 3 months for further assessment regarding possibility of extension off duration of anticoagulation therapy.   Superficial thrombophlebitis of left leg I recommend elastic compression stockings, 20-30 mmHg  Tobacco abuse counseling I spent some time counseling the patient the importance of tobacco cessation. he is currently attempting to quit on his own  Morbid obesity due to excess calories (HCC) This was compounded by difficulties with mobilities due to prior injuries and chronic pain  Financial difficulties He is running out of his prescription of ELiquis and is afraid that he will need to  be switched over to Coumadin. He said that his mother died from complication related to warfarin therapy. I gave him a drop prescription card and will switch him to Xarelto. He will call me back next month if he has problems getting refills.    Orders Placed This Encounter  Procedures  . CBC with Differential/Platelet    Standing Status: Future     Number of Occurrences:      Standing Expiration Date: 08/31/2016  . Comprehensive metabolic panel    Standing Status: Future     Number of Occurrences:      Standing Expiration Date: 08/31/2016    All questions were answered. The patient knows to call the clinic with any problems, questions or concerns. I spent 40 minutes counseling the patient face to face. The total time spent in the appointment was 55 minutes and more than 50% was on counseling.     [facility] Center[facility], Ferrell Flam, MD 07/28/2015 2:11 PM

## 2015-07-28 NOTE — Assessment & Plan Note (Signed)
I recommend elastic compression stockings, 20-30 mmHg

## 2015-07-28 NOTE — Assessment & Plan Note (Addendum)
I reviewed with the patient about the plan for care for left lower extremity distal DVT.  This last episode of blood clot appeared to be provoked by smoking and chronic immobility. I do not see a reason to order excessive testing to screen for thrombophilia disorder as it would not change our management.  The goal of anticoagulation therapy is for 3 months, possibly longer if the patient continues to smoke.  We discussed about various options of anticoagulation therapies including warfarin, low molecular weight heparin such as Lovenox or newer agents such as Rivaroxaban. Some of the risks and benefits discussed including costs involved, the need for monitoring, risks of life-threatening bleeding/hospitalization, reversibility of each agent in the event of bleeding or overdose, safety profile of each drug and taking into account other social issues such as ease of administration of medications, etc. Ultimately, we have made an informed decision for the patient to continue his treatment with Eliquis, to be switched over to Xarelto  I recommend the patient to use elastic compression stockings at 20-30 mmHg to reduce risks of chronic thrombophlebitis.  Finally, at the end of our consultation today, I reinforced the importance of preventive strategies such as avoiding hormonal supplement, avoiding cigarette smoking, keeping up-to-date with screening programs for early cancer detection, frequent ambulation for long distance travel and aggressive DVT prophylaxis in all surgical settings.  I plan to see him back in 3 months for further assessment regarding possibility of extension off duration of anticoagulation therapy.

## 2015-07-28 NOTE — Assessment & Plan Note (Signed)
He is running out of his prescription of ELiquis and is afraid that he will need to be switched over to Coumadin. He said that his mother died from complication related to warfarin therapy. I gave him a drop prescription card and will switch him to Xarelto. He will call me back next month if he has problems getting refills.

## 2015-07-28 NOTE — Telephone Encounter (Signed)
Gave and printed appt sched and avs for pt for June °

## 2015-07-28 NOTE — Assessment & Plan Note (Signed)
This was compounded by difficulties with mobilities due to prior injuries and chronic pain

## 2015-07-29 ENCOUNTER — Telehealth: Payer: Self-pay | Admitting: *Deleted

## 2015-07-29 ENCOUNTER — Encounter: Payer: Self-pay | Admitting: *Deleted

## 2015-07-29 NOTE — Telephone Encounter (Signed)
Full hypercoagulation results received and forwarded to Dr. Bertis RuddyGorsuch for review.

## 2015-07-29 NOTE — Progress Notes (Signed)
Prior Auth for Xarelto required/  Form received from [facility].  Forwarded PA form to Raquel in managed care dept.

## 2015-07-29 NOTE — Telephone Encounter (Signed)
[employer] [phone] and s/w Neysa BonitoChristy.  to request Hypercoagulation Profile lab results.   Dr. Bertis RuddyGorsuch received partial results w/ referral information.  Requested full profile results faxed to our office.

## 2015-07-30 ENCOUNTER — Encounter: Payer: Self-pay | Admitting: Hematology and Oncology

## 2015-07-30 NOTE — Progress Notes (Signed)
Sent prior auth for xarelto--covermymeds

## 2015-08-03 ENCOUNTER — Telehealth: Payer: Self-pay | Admitting: *Deleted

## 2015-08-03 NOTE — Telephone Encounter (Signed)
Called pt to f/u on Xarelto.  He was able to get PA done and is going to pick up from pharmacy now.

## 2015-09-25 ENCOUNTER — Other Ambulatory Visit (HOSPITAL_BASED_OUTPATIENT_CLINIC_OR_DEPARTMENT_OTHER): Payer: Medicare Other

## 2015-09-25 ENCOUNTER — Encounter: Payer: Self-pay | Admitting: Hematology and Oncology

## 2015-09-25 ENCOUNTER — Ambulatory Visit (HOSPITAL_BASED_OUTPATIENT_CLINIC_OR_DEPARTMENT_OTHER): Payer: Medicare Other | Admitting: Hematology and Oncology

## 2015-09-25 ENCOUNTER — Telehealth: Payer: Self-pay | Admitting: Hematology and Oncology

## 2015-09-25 VITALS — BP 120/64 | HR 78 | Temp 98.6°F | Resp 18 | Ht 75.0 in | Wt 344.4 lb

## 2015-09-25 DIAGNOSIS — I82452 Acute embolism and thrombosis of left peroneal vein: Secondary | ICD-10-CM

## 2015-09-25 DIAGNOSIS — I82492 Acute embolism and thrombosis of other specified deep vein of left lower extremity: Secondary | ICD-10-CM

## 2015-09-25 DIAGNOSIS — K2100 Gastro-esophageal reflux disease with esophagitis, without bleeding: Secondary | ICD-10-CM

## 2015-09-25 DIAGNOSIS — Z720 Tobacco use: Secondary | ICD-10-CM | POA: Diagnosis not present

## 2015-09-25 DIAGNOSIS — K219 Gastro-esophageal reflux disease without esophagitis: Secondary | ICD-10-CM | POA: Insufficient documentation

## 2015-09-25 DIAGNOSIS — Z716 Tobacco abuse counseling: Secondary | ICD-10-CM

## 2015-09-25 LAB — CBC WITH DIFFERENTIAL/PLATELET
BASO%: 0.3 % (ref 0.0–2.0)
BASOS ABS: 0 10*3/uL (ref 0.0–0.1)
EOS%: 3.6 % (ref 0.0–7.0)
Eosinophils Absolute: 0.4 10*3/uL (ref 0.0–0.5)
HEMATOCRIT: 40 % (ref 38.4–49.9)
HEMOGLOBIN: 13.8 g/dL (ref 13.0–17.1)
LYMPH%: 25.5 % (ref 14.0–49.0)
MCH: 32.3 pg (ref 27.2–33.4)
MCHC: 34.5 g/dL (ref 32.0–36.0)
MCV: 93.7 fL (ref 79.3–98.0)
MONO#: 1 10*3/uL — AB (ref 0.1–0.9)
MONO%: 8.3 % (ref 0.0–14.0)
NEUT#: 7.5 10*3/uL — ABNORMAL HIGH (ref 1.5–6.5)
NEUT%: 62.3 % (ref 39.0–75.0)
PLATELETS: 298 10*3/uL (ref 140–400)
RBC: 4.27 10*6/uL (ref 4.20–5.82)
RDW: 12.5 % (ref 11.0–14.6)
WBC: 12.1 10*3/uL — ABNORMAL HIGH (ref 4.0–10.3)
lymph#: 3.1 10*3/uL (ref 0.9–3.3)

## 2015-09-25 LAB — COMPREHENSIVE METABOLIC PANEL
ALT: 28 U/L (ref 0–55)
ANION GAP: 10 meq/L (ref 3–11)
AST: 17 U/L (ref 5–34)
Albumin: 3.6 g/dL (ref 3.5–5.0)
Alkaline Phosphatase: 60 U/L (ref 40–150)
BILIRUBIN TOTAL: 0.35 mg/dL (ref 0.20–1.20)
BUN: 13.4 mg/dL (ref 7.0–26.0)
CALCIUM: 9.3 mg/dL (ref 8.4–10.4)
CO2: 27 mEq/L (ref 22–29)
CREATININE: 1.1 mg/dL (ref 0.7–1.3)
Chloride: 102 mEq/L (ref 98–109)
EGFR: 79 mL/min/{1.73_m2} — ABNORMAL LOW (ref >90)
Glucose: 210 mg/dl — ABNORMAL HIGH (ref 70–140)
Potassium: 4.3 mEq/L (ref 3.5–5.1)
Sodium: 139 mEq/L (ref 136–145)
Total Protein: 7.2 g/dL (ref 6.4–8.3)

## 2015-09-25 NOTE — Assessment & Plan Note (Signed)
I reviewed with the patient about the plan for care for left lower extremity distal DVT.  This last episode of blood clot appeared to be provoked by smoking and chronic immobility. I do not see a reason to order excessive testing to screen for thrombophilia disorder as it would not change our management.  The goal of anticoagulation therapy is for 3 months, possibly longer if the patient continues to smoke. I recommend the patient to use elastic compression stockings at 20-30 mmHg to reduce risks of chronic thrombophlebitis. Finally, at the end of our consultation today, I reinforced the importance of preventive strategies such as avoiding hormonal supplement, avoiding cigarette smoking, keeping up-to-date with screening programs for early cancer detection, frequent ambulation for long distance travel and aggressive DVT prophylaxis in all surgical settings. I recommend he finish his current prescription in 2 weeks and then switched to 81 mg aspirin therapy.

## 2015-09-25 NOTE — Progress Notes (Signed)
[facility] OFFICE PROGRESS NOTE  Jarrett SohoWharton, Courtney, PA-C SUMMARY OF HEMATOLOGIC HISTORY:  Adam [name] 50 y.o. male is here because of recent diagnosis of DVT. This patient is morbidly obese with reduced mobility due to chronic pain related to old injuries and is a smoker. The patient has acute onset of left calf pain around 06/24/2015. He woke up with left leg pain. Immediately the day after, he developed significant left leg swelling. He subsequently saw his pain specialist on 06/26/2015 and was directed to the local emergency department. I reviewed the electronic records which showed that the patient has acute DVT involving the peroneal vein. He was given a starter pack with our Eliquis and was discharged to next day. His calf pain resolved within several days. Since then, he has chronic achiness around the left calf region with intermittent swelling.  He denies recent chest pain on exertion, shortness of breath on minimal exertion, pre-syncopal episodes, hemoptysis, or palpitation. He denies recent history of trauma, long distance travel, dehydration or recent surgery. The patient had chronic smoking history and typically is not very mobile. His mobility is limited by his chronic back pain and knee pain, morbid obesity and chronic injuries. Usually, he would have 4-5 active days and several days of bed rest at home. He had no prior history or diagnosis of cancer. His age appropriate screening programs are up-to-date. He had prior surgeries before and never had perioperative thromboembolic events. The patient had never been exposed to testosterone replacement therapy. His mother who is deceased had history of warfarin use although of unknown circumstances. She developed significant complication related to warfarin therapy with retroperitoneal hematoma and subsequently died from unrelated reason. The patient denies any recent signs or symptoms of bleeding such as spontaneous  epistaxis, hematuria or hematochezia. The patient was placed on Eliquis INTERVAL HISTORY: Adam ShownCarl K Bowsher 50 y.o. male returns for further follow-up. He is motivated to quit smoking, down to 4 cigarettes a day. He had recent EGD evaluation and was found to have GERD and Barrett's esophagus with large hiatal hernia. The patient denies any recent signs or symptoms of bleeding such as spontaneous epistaxis, hematuria or hematochezia. He stated he was compliant using elastic compression hose. Denies leg swelling  I have reviewed the past medical history, past surgical history, social history and family history with the patient and they are unchanged from previous note.  ALLERGIES:  has No Known Allergies.  MEDICATIONS:  Current Outpatient Prescriptions  Medication Sig Dispense Refill  . CARAFATE 1 GM/10ML suspension Take 10 mLs by mouth 2 (two) times daily. For 30 days.  0  . clonazePAM (KLONOPIN) 0.5 MG tablet Take 0.5 mg by mouth 2 (two) times daily as needed.    Marland Kitchen. levothyroxine (SYNTHROID, LEVOTHROID) 100 MCG tablet Take 100 mcg by mouth daily before breakfast.    . lisinopril-hydrochlorothiazide (PRINZIDE,ZESTORETIC) 20-25 MG per tablet Take 1 tablet by mouth daily.    . metFORMIN (GLUCOPHAGE) 500 MG tablet Take 500 mg by mouth 2 (two) times daily.    Marland Kitchen. morphine (MS CONTIN) 30 MG 12 hr tablet Take 30 mg by mouth 3 (three) times daily.    Marland Kitchen. omeprazole (PRILOSEC) 40 MG capsule Take 40 mg by mouth daily.  1  . oxycodone (ROXICODONE) 30 MG immediate release tablet Take 30 mg by mouth 2 (two) times daily as needed.  0  . pravastatin (PRAVACHOL) 40 MG tablet Take 40 mg by mouth daily.    . rivaroxaban (XARELTO) 20  MG TABS tablet Take 1 tablet (20 mg total) by mouth daily with supper. 30 tablet 3  . zolpidem (AMBIEN) 10 MG tablet Take 10 mg by mouth at bedtime as needed for sleep.     No current facility-administered medications for this visit.     REVIEW OF SYSTEMS:   Constitutional: Denies  fevers, chills or night sweats Eyes: Denies blurriness of vision Ears, nose, mouth, throat, and face: Denies mucositis or sore throat Respiratory: Denies cough, dyspnea or wheezes Cardiovascular: Denies palpitation, chest discomfort or lower extremity swelling Gastrointestinal:  Denies nausea, heartburn or change in bowel habits Skin: Denies abnormal skin rashes Lymphatics: Denies new lymphadenopathy or easy bruising Neurological:Denies numbness, tingling or new weaknesses Behavioral/Psych: Mood is stable, no new changes  All other systems were reviewed with the patient and are negative.  PHYSICAL EXAMINATION: ECOG PERFORMANCE STATUS: 0 - Asymptomatic  Filed Vitals:   09/25/15 1013  BP: 120/64  Pulse: 78  Temp: 98.6 F (37 C)  Resp: 18   Filed Weights   09/25/15 1013  Weight: 344 lb 6.4 oz (156.219 kg)    GENERAL:alert, no distress and comfortable. He is morbidly obese SKIN: skin color, texture, turgor are normal, no rashes or significant lesions Musculoskeletal:no cyanosis of digits and no clubbing  NEURO: alert & oriented x 3 with fluent speech, no focal motor/sensory deficits  LABORATORY DATA:  I have reviewed the data as listed     Component Value Date/Time   NA 139 09/25/2015 0954   K 4.3 09/25/2015 0954   CO2 27 09/25/2015 0954   GLUCOSE 210* 09/25/2015 0954   BUN 13.4 09/25/2015 0954   CREATININE 1.1 09/25/2015 0954   CALCIUM 9.3 09/25/2015 0954   PROT 7.2 09/25/2015 0954   ALBUMIN 3.6 09/25/2015 0954   AST 17 09/25/2015 0954   ALT 28 09/25/2015 0954   ALKPHOS 60 09/25/2015 0954   BILITOT 0.35 09/25/2015 0954    No results found for: SPEP, UPEP  Lab Results  Component Value Date   WBC 12.1* 09/25/2015   NEUTROABS 7.5* 09/25/2015   HGB 13.8 09/25/2015   HCT 40.0 09/25/2015   MCV 93.7 09/25/2015   PLT 298 09/25/2015      Chemistry      Component Value Date/Time   NA 139 09/25/2015 0954   K 4.3 09/25/2015 0954   CO2 27 09/25/2015 0954   BUN  13.4 09/25/2015 0954   CREATININE 1.1 09/25/2015 0954      Component Value Date/Time   CALCIUM 9.3 09/25/2015 0954   ALKPHOS 60 09/25/2015 0954   AST 17 09/25/2015 0954   ALT 28 09/25/2015 0954   BILITOT 0.35 09/25/2015 0954      ASSESSMENT & PLAN:  Left peroneal vein thrombosis (HCC) I reviewed with the patient about the plan for care for left lower extremity distal DVT.  This last episode of blood clot appeared to be provoked by smoking and chronic immobility. I do not see a reason to order excessive testing to screen for thrombophilia disorder as it would not change our management.  The goal of anticoagulation therapy is for 3 months, possibly longer if the patient continues to smoke. I recommend the patient to use elastic compression stockings at 20-30 mmHg to reduce risks of chronic thrombophlebitis. Finally, at the end of our consultation today, I reinforced the importance of preventive strategies such as avoiding hormonal supplement, avoiding cigarette smoking, keeping up-to-date with screening programs for early cancer detection, frequent ambulation for long distance travel  and aggressive DVT prophylaxis in all surgical settings. I recommend he finish his current prescription in 2 weeks and then switched to 81 mg aspirin therapy.  Tobacco abuse counseling He has made significant progress and was able to cut cigarette smoking down to 4 a day and he is motivated to quit smoking completely in 2 weeks time.  GERD (gastroesophageal reflux disease) The patient has significant reflux with recent finding of esophagitis. I do not have the copy of the EGD report. Certainly, aspirin therapy could increase risk of GI bleed but he does prevent risk of recurrence of blood clot. I recommend he takes his omeprazole and take aspirin with food.   All questions were answered. The patient knows to call the clinic with any problems, questions or concerns. No barriers to learning was detected.  I  spent 15 minutes counseling the patient face to face. The total time spent in the appointment was 20 minutes and more than 50% was on counseling.     [facility], Weslyn Holsonback, MD 6/16/201711:47 AM

## 2015-09-25 NOTE — Assessment & Plan Note (Signed)
The patient has significant reflux with recent finding of esophagitis. I do not have the copy of the EGD report. Certainly, aspirin therapy could increase risk of GI bleed but he does prevent risk of recurrence of blood clot. I recommend he takes his omeprazole and take aspirin with food.

## 2015-09-25 NOTE — Assessment & Plan Note (Signed)
He has made significant progress and was able to cut cigarette smoking down to 4 a day and he is motivated to quit smoking completely in 2 weeks time.

## 2015-09-25 NOTE — Telephone Encounter (Signed)
Gave pt avs °

## 2015-10-09 ENCOUNTER — Other Ambulatory Visit: Payer: Self-pay | Admitting: Gastroenterology

## 2015-10-09 DIAGNOSIS — R1013 Epigastric pain: Secondary | ICD-10-CM

## 2015-10-16 ENCOUNTER — Ambulatory Visit
Admission: RE | Admit: 2015-10-16 | Discharge: 2015-10-16 | Disposition: A | Discharge status: Home / Self Care | Payer: Medicare Other | Source: Ambulatory Visit | Attending: Gastroenterology | Admitting: Gastroenterology

## 2015-10-16 DIAGNOSIS — R1013 Epigastric pain: Secondary | ICD-10-CM

## 2015-11-30 ENCOUNTER — Other Ambulatory Visit (HOSPITAL_COMMUNITY): Payer: Self-pay | Admitting: Gastroenterology

## 2015-11-30 DIAGNOSIS — R1013 Epigastric pain: Secondary | ICD-10-CM

## 2015-12-08 ENCOUNTER — Ambulatory Visit (HOSPITAL_COMMUNITY)
Admission: RE | Admit: 2015-12-08 | Discharge: 2015-12-08 | Disposition: A | Discharge status: Home / Self Care | Payer: Medicare Other | Source: Ambulatory Visit | Attending: Gastroenterology | Admitting: Gastroenterology

## 2015-12-08 DIAGNOSIS — R1013 Epigastric pain: Secondary | ICD-10-CM

## 2015-12-11 ENCOUNTER — Encounter (HOSPITAL_COMMUNITY)
Admission: RE | Admit: 2015-12-11 | Discharge: 2015-12-11 | Disposition: A | Discharge status: Home / Self Care | Payer: Medicare Other | Source: Ambulatory Visit | Attending: Gastroenterology | Admitting: Gastroenterology

## 2015-12-11 DIAGNOSIS — R1013 Epigastric pain: Secondary | ICD-10-CM | POA: Insufficient documentation

## 2015-12-11 MED ORDER — TECHNETIUM TC 99M MEBROFENIN IV KIT
5.0000 | PACK | Freq: Once | INTRAVENOUS | Status: AC | PRN
  Administered 2015-12-11: 5 via INTRAVENOUS

## 2015-12-12 ENCOUNTER — Encounter (HOSPITAL_COMMUNITY): Payer: Medicare Other

## 2015-12-25 ENCOUNTER — Other Ambulatory Visit: Payer: Self-pay | Admitting: Gastroenterology

## 2015-12-25 DIAGNOSIS — R1013 Epigastric pain: Secondary | ICD-10-CM

## 2015-12-31 ENCOUNTER — Ambulatory Visit
Admission: RE | Admit: 2015-12-31 | Discharge: 2015-12-31 | Disposition: A | Discharge status: Home / Self Care | Payer: Medicare Other | Source: Ambulatory Visit | Attending: Gastroenterology | Admitting: Gastroenterology

## 2015-12-31 DIAGNOSIS — R1013 Epigastric pain: Secondary | ICD-10-CM

## 2015-12-31 MED ORDER — IOPAMIDOL (ISOVUE-300) INJECTION 61%
125.0000 mL | Freq: Once | INTRAVENOUS | Status: AC | PRN
  Administered 2015-12-31: 125 mL via INTRAVENOUS

## 2017-09-20 ENCOUNTER — Ambulatory Visit (INDEPENDENT_AMBULATORY_CARE_PROVIDER_SITE_OTHER): Payer: Medicare Other

## 2017-09-20 ENCOUNTER — Ambulatory Visit (INDEPENDENT_AMBULATORY_CARE_PROVIDER_SITE_OTHER): Payer: Medicare Other | Admitting: Orthopedic Surgery

## 2017-09-20 ENCOUNTER — Encounter (INDEPENDENT_AMBULATORY_CARE_PROVIDER_SITE_OTHER): Payer: Self-pay | Admitting: Orthopedic Surgery

## 2017-09-20 DIAGNOSIS — M1712 Unilateral primary osteoarthritis, left knee: Secondary | ICD-10-CM

## 2017-09-20 DIAGNOSIS — M25562 Pain in left knee: Secondary | ICD-10-CM

## 2017-09-20 DIAGNOSIS — G8929 Other chronic pain: Secondary | ICD-10-CM

## 2017-09-20 NOTE — Progress Notes (Signed)
Office Visit Note   Patient: Adam [name]           Date of Birth: Jul 18, 1965           MRN: [id number] Visit Date: 09/20/2017 Requested by: Jarrett Soho, PA-C [address] [place], [place] [address] PCP: Jarrett Soho, PA-C  Subjective: Chief Complaint  Patient presents with  . Left Knee - Pain    HPI: [name] is a patient with left knee pain.  He ambulates with a cane.  He describes chronic pain but worse over the last 6 months.  He has a complicated medical history.  He is on disability for a right foot injury sustained about 15 years ago.  He is also in the pain clinic and takes morphine 30 mg 3 times a day and Roxicodone 30 mg twice a day.  Notably he has had a deep vein thrombosis in the left leg in the past.  He currently is not taking anti-inflammatories.  He states his last hemoglobin A1c was 7.3.  He states he has lost 21 pounds over the past 7 weeks.  His symptoms became worse when he had a slipping injury in December 2018.              ROS: All systems reviewed are negative as they relate to the chief complaint within the history of present illness.  Patient denies  fevers or chills.   Assessment & Plan: Visit Diagnoses:  1. Chronic pain of left knee   2. Unilateral primary osteoarthritis, left knee     Plan: Impression is left knee arthritis with history of left knee arthroscopy in January 2016 by Dr. Turner Daniels.  At that time Dr. Turner Daniels thought that he would need knee replacement at some time in the future; however, he is not able to perform that procedure because of the patient's body mass index and currently where he operates.  He would like for him to be under 40 BMI.  There are multiple problems with Baldo Ash and the potential for knee replacement.  One is he has diminished pulses.  The other is he has a history of deep vein thrombosis in that left leg.  Another problem is his diabetes which seems to be under reasonable control.  He is very young for any type of knee  replacement.  Perhaps the biggest obstacle is the amount of pain medicine that Labib is taking.  My experience with several patients who are on this amount of pain medication is that their pain is essentially uncontrollable after surgery.  I do not think he really appreciates the degree of in severity of the pain he will be in after surgery.  I want him to continue losing weight.  Plan ultrasound to rule out DVT left lower extremity and we need to get ABIs bilateral lower extremities to evaluate for arterial insufficiency.  4-week return to see where he is at in that regard.  Follow-Up Instructions: Return in about 1 month (around 10/18/2017).   Orders:  Orders Placed This Encounter  Procedures  . XR KNEE 3 VIEW LEFT   No orders of the defined types were placed in this encounter.     Procedures: No procedures performed   Clinical Data: No additional findings.  Objective: Vital Signs: There were no vitals taken for this visit.  Physical Exam:   Constitutional: Patient appears well-developed HEENT:  Head: Normocephalic Eyes:EOM are normal Neck: Normal range of motion Cardiovascular: Normal rate Pulmonary/chest: Effort normal Neurologic: Patient is  alert Skin: Skin is warm Psychiatric: Patient has normal mood and affect    Ortho Exam: Ortho exam demonstrates BMI of 43.1.  He has perfused feet.  Pulses are not that palpable.  Has increased swelling in the left leg compared to the right.  This is primarily calf level and below.  No effusion in either knee.  Flexion is to about 105 degrees to 110 degrees on the left compared to 120 on the right.  Extensor mechanism is intact bilaterally.  Does have evidence of venous stasis on the left compared to the right.  No other masses lymphadenopathy or skin changes noted in the left knee region.  Specialty Comments:  No specialty comments available.  Imaging: Xr Knee 3 View Left  Result Date: 09/20/2017 AP lateral merchant left knee  reviewed.  Tricompartmental degenerative changes noted worse in the lateral compartment.  Overall alignment intact.  No fracture or dislocation is seen.  No loose bodies.  Most of the spurring is on the lateral side.    PMFS History: Patient Active Problem List   Diagnosis Date Noted  . GERD (gastroesophageal reflux disease) 09/25/2015  . Superficial thrombophlebitis of left leg 07/28/2015  . Tobacco abuse counseling 07/28/2015  . Morbid obesity due to excess calories (HCC) 07/28/2015  . Financial difficulties 07/28/2015  . Left peroneal vein thrombosis (HCC) 07/13/2015   Past Medical History:  Diagnosis Date  . Arthritis   . Chronic pain   . Diabetes mellitus without complication (HCC)   . Full dentures   . GERD (gastroesophageal reflux disease)   . Hyperlipemia   . Hypertension   . Hypothyroidism   . Peripheral neuropathic pain   . Sleep apnea    hx-had corrective surgery  . Spinal cord stimulator status     Family History  Problem Relation Age of Onset  . Clotting disorder Mother     Past Surgical History:  Procedure Laterality Date  . ANKLE ARTHROPLASTY  2005   right ankle injury  . CHONDROPLASTY Left 04/14/2014   Procedure: CHONDROPLASTY Patella and tibial condyle;  Surgeon: Nestor LewandowskyFrank J Rowan, MD;  Location: [facility];  Service: Orthopedics;  Laterality: Left;  . I&D EXTREMITY  2005   right ankle  . KNEE ARTHROSCOPY     left x2  . KNEE ARTHROSCOPY Left 04/14/2014   Procedure: LEFT KNEE ARTHROSCOPY removal loose body;  Surgeon: Nestor LewandowskyFrank J Rowan, MD;  Location: [facility];  Service: Orthopedics;  Laterality: Left;  . KNEE ARTHROSCOPY WITH LATERAL MENISECTOMY Left 04/14/2014   Procedure: KNEE ARTHROSCOPY WITH LATERAL MENISECTOMY;  Surgeon: Nestor LewandowskyFrank J Rowan, MD;  Location: [facility];  Service: Orthopedics;  Laterality: Left;  . KNEE ARTHROSCOPY WITH MEDIAL MENISECTOMY Left 04/14/2014   Procedure: KNEE ARTHROSCOPY WITH MEDIAL MENISECTOMY;   Surgeon: Nestor LewandowskyFrank J Rowan, MD;  Location: [facility];  Service: Orthopedics;  Laterality: Left;  Marland Kitchen. MULTIPLE TOOTH EXTRACTIONS  2015  . SPINAL CORD STIMULATOR IMPLANT  2008  . SPINAL CORD STIMULATOR IMPLANT  2008   revision  . UVULOPALATOPHARYNGOPLASTY  2000   Social History   Occupational History  . Not on file  Tobacco Use  . Smoking status: Current Every Day Smoker    Packs/day: 1.00  . Smokeless tobacco: Never Used  Substance and Sexual Activity  . Alcohol use: Yes    Comment: rare  . Drug use: No  . Sexual activity: Not on file

## 2017-09-28 ENCOUNTER — Ambulatory Visit (HOSPITAL_BASED_OUTPATIENT_CLINIC_OR_DEPARTMENT_OTHER)
Admission: RE | Admit: 2017-09-28 | Discharge: 2017-09-28 | Disposition: A | Discharge status: Home / Self Care | Payer: Medicare Other | Source: Ambulatory Visit | Attending: Orthopedic Surgery | Admitting: Orthopedic Surgery

## 2017-09-28 ENCOUNTER — Ambulatory Visit (HOSPITAL_COMMUNITY)
Admission: RE | Admit: 2017-09-28 | Discharge: 2017-09-28 | Disposition: A | Discharge status: Home / Self Care | Payer: Medicare Other | Source: Ambulatory Visit | Attending: Orthopedic Surgery | Admitting: Orthopedic Surgery

## 2017-09-28 ENCOUNTER — Telehealth (INDEPENDENT_AMBULATORY_CARE_PROVIDER_SITE_OTHER): Payer: Self-pay

## 2017-09-28 DIAGNOSIS — G8929 Other chronic pain: Secondary | ICD-10-CM | POA: Diagnosis not present

## 2017-09-28 DIAGNOSIS — M25562 Pain in left knee: Secondary | ICD-10-CM

## 2017-09-28 NOTE — Telephone Encounter (Signed)
[facility] vasc lab called to advise patients doppler of left lower ext negative for DVT and the bilateral lower ext ABI studies were within normal limits. Please advise how patient should proceed. Thanks

## 2017-09-28 NOTE — Progress Notes (Signed)
Left lower extremity venous duplex has been completed. Negative for DVT.  ABI's have been completed. Right 1.04 Left 1.09  Results were given to Lauren at Dr. Diamantina Providenceean's office.  09/28/17 3:36 PM Olen CordialGreg Kauri Garson RVT

## 2017-10-18 ENCOUNTER — Ambulatory Visit (INDEPENDENT_AMBULATORY_CARE_PROVIDER_SITE_OTHER): Payer: Medicare Other | Admitting: Orthopedic Surgery

## 2017-10-18 ENCOUNTER — Encounter (INDEPENDENT_AMBULATORY_CARE_PROVIDER_SITE_OTHER): Payer: Self-pay | Admitting: Orthopedic Surgery

## 2017-10-18 DIAGNOSIS — Z6841 Body Mass Index (BMI) 40.0 and over, adult: Secondary | ICD-10-CM

## 2017-10-18 DIAGNOSIS — M1712 Unilateral primary osteoarthritis, left knee: Secondary | ICD-10-CM | POA: Diagnosis not present

## 2017-10-22 ENCOUNTER — Encounter (INDEPENDENT_AMBULATORY_CARE_PROVIDER_SITE_OTHER): Payer: Self-pay | Admitting: Orthopedic Surgery

## 2017-10-22 NOTE — Progress Notes (Signed)
Office Visit Note   Patient: Adam [name]           Date of Birth: 1965/05/29           MRN: [id number] Visit Date: 10/18/2017 Requested by: Jarrett SohoWharton, Courtney, PA-C [address] [place], [place] [address] PCP: Jarrett SohoWharton, Courtney, PA-C  Subjective: Chief Complaint  Patient presents with  . Left Knee - Follow-up    HPI: Adam AshCarl is a patient who is 1 month out from ABIs and Doppler ultrasound.  He has end-stage left knee arthritis.  Had arthroscopy in 2016 and knee replacement was going to be the next step.  He is on significant pain medicine but has been tapering down.  He does not have teeth he has dentures.  He has had 5 surgeries on the left knee.  He has father sister and nephew at home.  He also has a spine stimulator in place.  The spine stimulator is not functioning however.              ROS: All systems reviewed are negative as they relate to the chief complaint within the history of present illness.  Patient denies  fevers or chills.   Assessment & Plan: Visit Diagnoses:  1. Body mass index 40.0-44.9, adult (HCC)   2. Unilateral primary osteoarthritis, left knee     Plan: Impression is end-stage left knee arthritis the patient with slightly increased body mass index at 43.  He is 52 years old.  He will have a difficult time with the recovery from the surgery due to his preop pain medicine.  This is explained to him in detail in terms of what will likely be the agony he will feel.  I will try to arrange for preop femoral nerve catheter.  Even though I explained to him that this will be quite possibly the most painful experience of his entire life he still desires to proceed with total knee replacement.  Again my experience with several patients who have been on this amount of pain medicine has been that it is very difficult to obtain pain relief for these patients.  The other risk benefits of surgery including but not limited to infection knee stiffness incomplete pain relief as  well as potential need for revision surgery in her lifetime are discussed.  All questions answered.  He would be a patient we would consider using press-fit prosthesis on if bone quality allows.  Follow-Up Instructions: No follow-ups on file.   Orders:  No orders of the defined types were placed in this encounter.  No orders of the defined types were placed in this encounter.     Procedures: No procedures performed   Clinical Data: No additional findings.  Objective: Vital Signs: There were no vitals taken for this visit.  Physical Exam:   Constitutional: Patient appears well-developed HEENT:  Head: Normocephalic Eyes:EOM are normal Neck: Normal range of motion Cardiovascular: Normal rate Pulmonary/chest: Effort normal Neurologic: Patient is alert Skin: Skin is warm Psychiatric: Patient has normal mood and affect    Ortho Exam: Orthopedic exam demonstrates palpable pedal pulses bilaterally stronger than they were last clinic visit.  ABIs were normal and Doppler ultrasound was negative for DVT.  Patient did have a history of peroneal vein DVT in the past but that has resolved.  He has medial and lateral joint line tenderness and intact extensor mechanism.  Skin in the left knee region is intact with no proximal lymphadenopathy and no groin pain on the  left-hand side with internal and external rotation of the leg.  Range of motion is about 5 degrees to flexion past 80 degrees but not quite to 90.  There is no calf tenderness today.  There is very mild venous stasis bilateral lower extremities.  Specialty Comments:  No specialty comments available.  Imaging: No results found.   PMFS History: Patient Active Problem List   Diagnosis Date Noted  . GERD (gastroesophageal reflux disease) 09/25/2015  . Superficial thrombophlebitis of left leg 07/28/2015  . Tobacco abuse counseling 07/28/2015  . Morbid obesity due to excess calories (HCC) 07/28/2015  . Financial difficulties  07/28/2015  . Left peroneal vein thrombosis (HCC) 07/13/2015   Past Medical History:  Diagnosis Date  . Arthritis   . Chronic pain   . Diabetes mellitus without complication (HCC)   . Full dentures   . GERD (gastroesophageal reflux disease)   . Hyperlipemia   . Hypertension   . Hypothyroidism   . Peripheral neuropathic pain   . Sleep apnea    hx-had corrective surgery  . Spinal cord stimulator status     Family History  Problem Relation Age of Onset  . Clotting disorder Mother     Past Surgical History:  Procedure Laterality Date  . ANKLE ARTHROPLASTY  2005   right ankle injury  . CHONDROPLASTY Left 04/14/2014   Procedure: CHONDROPLASTY Patella and tibial condyle;  Surgeon: Nestor Lewandowsky, MD;  Location: [facility];  Service: Orthopedics;  Laterality: Left;  . I&D EXTREMITY  2005   right ankle  . KNEE ARTHROSCOPY     left x2  . KNEE ARTHROSCOPY Left 04/14/2014   Procedure: LEFT KNEE ARTHROSCOPY removal loose body;  Surgeon: Nestor Lewandowsky, MD;  Location: [facility];  Service: Orthopedics;  Laterality: Left;  . KNEE ARTHROSCOPY WITH LATERAL MENISECTOMY Left 04/14/2014   Procedure: KNEE ARTHROSCOPY WITH LATERAL MENISECTOMY;  Surgeon: Nestor Lewandowsky, MD;  Location: [facility];  Service: Orthopedics;  Laterality: Left;  . KNEE ARTHROSCOPY WITH MEDIAL MENISECTOMY Left 04/14/2014   Procedure: KNEE ARTHROSCOPY WITH MEDIAL MENISECTOMY;  Surgeon: Nestor Lewandowsky, MD;  Location: [facility];  Service: Orthopedics;  Laterality: Left;  Marland Kitchen MULTIPLE TOOTH EXTRACTIONS  2015  . SPINAL CORD STIMULATOR IMPLANT  2008  . SPINAL CORD STIMULATOR IMPLANT  2008   revision  . UVULOPALATOPHARYNGOPLASTY  2000   Social History   Occupational History  . Not on file  Tobacco Use  . Smoking status: Current Every Day Smoker    Packs/day: 1.00  . Smokeless tobacco: Never Used  Substance and Sexual Activity  . Alcohol use: Yes    Comment: rare  . Drug  use: No  . Sexual activity: Not on file

## 2017-10-23 ENCOUNTER — Encounter (INDEPENDENT_AMBULATORY_CARE_PROVIDER_SITE_OTHER): Payer: Self-pay | Admitting: Orthopedic Surgery

## 2017-10-25 ENCOUNTER — Other Ambulatory Visit (INDEPENDENT_AMBULATORY_CARE_PROVIDER_SITE_OTHER): Payer: Self-pay | Admitting: Orthopedic Surgery

## 2017-10-30 ENCOUNTER — Other Ambulatory Visit (INDEPENDENT_AMBULATORY_CARE_PROVIDER_SITE_OTHER): Payer: Self-pay | Admitting: Orthopedic Surgery

## 2017-10-30 DIAGNOSIS — M1712 Unilateral primary osteoarthritis, left knee: Secondary | ICD-10-CM

## 2017-10-30 NOTE — Pre-Procedure Instructions (Signed)
Samantha Olivera Rudman  10/30/2017      [id number] - [place], [place] - [address] [facility] [address] Phone: [phone] Fax: [phone]    Your procedure is scheduled on JULY 30  Report to [facility] Admitting at 0530 A.M.  Call this number if you have problems the morning of surgery:  [phone]   Remember:  Do not eat or drink after midnight.      Take these medicines the morning of surgery with A SIP OF WATER   levothyroxine (SYNTHROID, LEVOTHROID) hydrOXYzine (ATARAX/VISTARIL) oxycodone (ROXICODONE) sertraline (ZOLOFT)   7 days prior to surgery STOP taking any Aspirin(unless otherwise instructed by your surgeon), Aleve, Naproxen, Ibuprofen, Motrin, Advil, Goody's, BC's, all herbal medications, fish oil, and all vitamins   WHAT DO I DO ABOUT MY DIABETES MEDICATION?   Marland Kitchen Do not take oral diabetes medicines (pills) the morning of surgery. metFORMIN (GLUCOPHAGE)  . The day of surgery, do not take other diabetes injectables, including Byetta (exenatide), Bydureon (exenatide ER), Victoza (liraglutide), or Trulicity (dulaglutide).  . If your CBG is greater than 220 mg/dL, you may take  of your sliding scale (correction) dose of insulin.   How to Manage Your Diabetes Before and After Surgery  Why is it important to control my blood sugar before and after surgery? . Improving blood sugar levels before and after surgery helps healing and can limit problems. . A way of improving blood sugar control is eating a healthy diet by: o  Eating less sugar and carbohydrates o  Increasing activity/exercise o  Talking with your doctor about reaching your blood sugar goals . High blood sugars (greater than 180 mg/dL) can raise your risk of infections and slow your recovery, so you will need to focus on controlling your diabetes during the weeks before surgery. . Make sure that the doctor who takes care of your diabetes knows about your  planned surgery including the date and location.  How do I manage my blood sugar before surgery? . Check your blood sugar at least 4 times a day, starting 2 days before surgery, to make sure that the level is not too high or low. o Check your blood sugar the morning of your surgery when you wake up and every 2 hours until you get to the Short Stay unit. . If your blood sugar is less than 70 mg/dL, you will need to treat for low blood sugar: o Do not take insulin. o Treat a low blood sugar (less than 70 mg/dL) with  cup of clear juice (cranberry or apple), 4 glucose tablets, OR glucose gel. o Recheck blood sugar in 15 minutes after treatment (to make sure it is greater than 70 mg/dL). If your blood sugar is not greater than 70 mg/dL on recheck, call [phone] for further instructions. . Report your blood sugar to the short stay nurse when you get to Short Stay.  . If you are admitted to the hospital after surgery: o Your blood sugar will be checked by the staff and you will probably be given insulin after surgery (instead of oral diabetes medicines) to make sure you have good blood sugar levels. o The goal for blood sugar control after surgery is 80-180 mg/dL.    Do not wear jewelry  Do not wear lotions, powders, or cologne, or deodorant.  Men may shave face and neck.  Do not bring valuables to the hospital.  [facility] is not responsible  for any belongings or valuables.  Contacts, dentures or bridgework may not be worn into surgery.  Leave your suitcase in the car.  After surgery it may be brought to your room.  For patients admitted to the hospital, discharge time will be determined by your treatment team.  Patients discharged the day of surgery will not be allowed to drive home.    Special instructions:   [facility]- Preparing For Surgery  Before surgery, you can play an important role. Because skin is not sterile, your skin needs to be as free of germs as possible. You can  reduce the number of germs on your skin by washing with CHG (chlorahexidine gluconate) Soap before surgery.  CHG is an antiseptic cleaner which kills germs and bonds with the skin to continue killing germs even after washing.    Oral Hygiene is also important to reduce your risk of infection.  Remember - BRUSH YOUR TEETH THE MORNING OF SURGERY WITH YOUR REGULAR TOOTHPASTE  Please do not use if you have an allergy to CHG or antibacterial soaps. If your skin becomes reddened/irritated stop using the CHG.  Do not shave (including legs and underarms) for at least 48 hours prior to first CHG shower. It is OK to shave your face.  Please follow these instructions carefully.   1. Shower the NIGHT BEFORE SURGERY and the MORNING OF SURGERY with CHG.   2. If you chose to wash your hair, wash your hair first as usual with your normal shampoo.  3. After you shampoo, rinse your hair and body thoroughly to remove the shampoo.  4. Use CHG as you would any other liquid soap. You can apply CHG directly to the skin and wash gently with a scrungie or a clean washcloth.   5. Apply the CHG Soap to your body ONLY FROM THE NECK DOWN.  Do not use on open wounds or open sores. Avoid contact with your eyes, ears, mouth and genitals (private parts). Wash Face and genitals (private parts)  with your normal soap.  6. Wash thoroughly, paying special attention to the area where your surgery will be performed.  7. Thoroughly rinse your body with warm water from the neck down.  8. DO NOT shower/wash with your normal soap after using and rinsing off the CHG Soap.  9. Pat yourself dry with a CLEAN TOWEL.  10. Wear CLEAN PAJAMAS to bed the night before surgery, wear comfortable clothes the morning of surgery  11. Place CLEAN SHEETS on your bed the night of your first shower and DO NOT SLEEP WITH PETS.    Day of Surgery:  Do not apply any deodorants/lotions.  Please wear clean clothes to the hospital/surgery center.    Remember to brush your teeth WITH YOUR REGULAR TOOTHPASTE.    Please read over the following fact sheets that you were given.

## 2017-10-31 ENCOUNTER — Encounter (HOSPITAL_COMMUNITY): Payer: Self-pay

## 2017-10-31 ENCOUNTER — Other Ambulatory Visit: Payer: Self-pay

## 2017-10-31 ENCOUNTER — Encounter (HOSPITAL_COMMUNITY)
Admission: RE | Admit: 2017-10-31 | Discharge: 2017-10-31 | Disposition: A | Discharge status: Home / Self Care | Payer: Medicare Other | Source: Ambulatory Visit | Attending: Orthopedic Surgery | Admitting: Orthopedic Surgery

## 2017-10-31 DIAGNOSIS — Z01812 Encounter for preprocedural laboratory examination: Secondary | ICD-10-CM | POA: Diagnosis not present

## 2017-10-31 DIAGNOSIS — Z01810 Encounter for preprocedural cardiovascular examination: Secondary | ICD-10-CM | POA: Diagnosis present

## 2017-10-31 HISTORY — DX: Pleurisy: R09.1

## 2017-10-31 LAB — BASIC METABOLIC PANEL
Anion gap: 10 (ref 5–15)
BUN: 15 mg/dL (ref 6–20)
CHLORIDE: 102 mmol/L (ref 98–111)
CO2: 25 mmol/L (ref 22–32)
CREATININE: 0.8 mg/dL (ref 0.61–1.24)
Calcium: 9.1 mg/dL (ref 8.9–10.3)
GFR calc Af Amer: >60 mL/min (ref >60)
GFR calc non Af Amer: >60 mL/min (ref >60)
GLUCOSE: 126 mg/dL — AB (ref 70–99)
POTASSIUM: 4.2 mmol/L (ref 3.5–5.1)
SODIUM: 137 mmol/L (ref 135–145)

## 2017-10-31 LAB — CBC
HEMATOCRIT: 46.2 % (ref 39.0–52.0)
Hemoglobin: 14.9 g/dL (ref 13.0–17.0)
MCH: 31.8 pg (ref 26.0–34.0)
MCHC: 32.3 g/dL (ref 30.0–36.0)
MCV: 98.7 fL (ref 78.0–100.0)
PLATELETS: 329 10*3/uL (ref 150–400)
RBC: 4.68 MIL/uL (ref 4.22–5.81)
RDW: 12.1 % (ref 11.5–15.5)
WBC: 11.7 10*3/uL — ABNORMAL HIGH (ref 4.0–10.5)

## 2017-10-31 LAB — SURGICAL PCR SCREEN
MRSA, PCR: NEGATIVE
Staphylococcus aureus: NEGATIVE

## 2017-10-31 LAB — PROTIME-INR
INR: 0.94
Prothrombin Time: 12.5 seconds (ref 11.4–15.2)

## 2017-10-31 LAB — URINALYSIS, ROUTINE W REFLEX MICROSCOPIC
Bilirubin Urine: NEGATIVE
Glucose, UA: NEGATIVE mg/dL
HGB URINE DIPSTICK: NEGATIVE
Ketones, ur: NEGATIVE mg/dL
Leukocytes, UA: NEGATIVE
NITRITE: NEGATIVE
Protein, ur: NEGATIVE mg/dL
SPECIFIC GRAVITY, URINE: 1.011 (ref 1.005–1.030)
pH: 6 (ref 5.0–8.0)

## 2017-10-31 LAB — TYPE AND SCREEN
ABO/RH(D): A POS
Antibody Screen: NEGATIVE

## 2017-10-31 LAB — ABO/RH: ABO/RH(D): A POS

## 2017-10-31 LAB — GLUCOSE, CAPILLARY: Glucose-Capillary: 124 mg/dL — ABNORMAL HIGH (ref 70–99)

## 2017-10-31 NOTE — Progress Notes (Signed)
PCP - Jarrett Sohoourtney Wharton, PA-C Cardiologist -denies   Chest x-ray - N/A EKG - 10/31/17 Stress Test - denies ECHO - denies Cardiac Cath - denies  Sleep Study - has had corrective surgery.  CPAP - denies  Fasting Blood Sugar - 120's Checks Blood Sugar 1 time a day  Anesthesia review:   Patient denies shortness of breath, fever, cough and chest pain at PAT appointment   Patient verbalized understanding of instructions that were given to them at the PAT appointment. Patient was also instructed that they will need to review over the PAT instructions again at home before surgery.

## 2017-11-01 ENCOUNTER — Other Ambulatory Visit (INDEPENDENT_AMBULATORY_CARE_PROVIDER_SITE_OTHER): Payer: Self-pay

## 2017-11-01 LAB — URINE CULTURE: Culture: NO GROWTH

## 2017-11-06 MED ORDER — DEXTROSE 5 % IV SOLN
3.0000 g | INTRAVENOUS | Status: AC
  Administered 2017-11-07: 3 g via INTRAVENOUS
  Filled 2017-11-06: qty 3

## 2017-11-06 NOTE — H&P (Signed)
TOTAL KNEE ADMISSION H&P  Patient is being admitted for left total knee arthroplasty.  Subjective:  Chief Complaint:left knee pain.  HPI: Adam [name], 52 y.o. male, has a history of pain and functional disability in the left knee due to arthritis and has failed non-surgical conservative treatments for greater than 12 weeks to includeNSAID's and/or analgesics, corticosteriod injections, weight reduction as appropriate and activity modification.  Onset of symptoms was gradual, starting 6 years ago with rapidlly worsening course since that time. The patient noted prior procedures on the knee to include  arthroscopy and menisectomy on the left knee(s).  Patient currently rates pain in the left knee(s) at 10 out of 10 with activity. Patient has night pain, worsening of pain with activity and weight bearing, pain that interferes with activities of daily living, pain with passive range of motion, crepitus and joint swelling.  Patient has evidence of periarticular osteophytes and joint space narrowing by imaging studies. This patient has had Left knee arthroscopy done several years ago at which time the surgeon told him that he would need knee replacement as a next step.. There is no active infection.  Patient Active Problem List   Diagnosis Date Noted  . GERD (gastroesophageal reflux disease) 09/25/2015  . Superficial thrombophlebitis of left leg 07/28/2015  . Tobacco abuse counseling 07/28/2015  . Morbid obesity due to excess calories (HCC) 07/28/2015  . Financial difficulties 07/28/2015  . Left peroneal vein thrombosis (HCC) 07/13/2015   Past Medical History:  Diagnosis Date  . Arthritis   . Chronic pain   . Diabetes mellitus without complication (HCC)   . Full dentures   . GERD (gastroesophageal reflux disease)   . Hyperlipemia   . Hypertension   . Hypothyroidism   . Peripheral neuropathic pain   . Pleurisy    as a kid; had lung drained. Scar tissue on the right lung.   . Sleep apnea     hx-had corrective surgery  . Spinal cord stimulator status     Past Surgical History:  Procedure Laterality Date  . ANKLE ARTHROPLASTY  2005   right ankle injury  . CHONDROPLASTY Left 04/14/2014   Procedure: CHONDROPLASTY Patella and tibial condyle;  Surgeon: Nestor Lewandowsky, MD;  Location: [facility];  Service: Orthopedics;  Laterality: Left;  . I&D EXTREMITY  2005   right ankle  . KNEE ARTHROSCOPY     left x2  . KNEE ARTHROSCOPY Left 04/14/2014   Procedure: LEFT KNEE ARTHROSCOPY removal loose body;  Surgeon: Nestor Lewandowsky, MD;  Location: [facility];  Service: Orthopedics;  Laterality: Left;  . KNEE ARTHROSCOPY WITH LATERAL MENISECTOMY Left 04/14/2014   Procedure: KNEE ARTHROSCOPY WITH LATERAL MENISECTOMY;  Surgeon: Nestor Lewandowsky, MD;  Location: [facility];  Service: Orthopedics;  Laterality: Left;  . KNEE ARTHROSCOPY WITH MEDIAL MENISECTOMY Left 04/14/2014   Procedure: KNEE ARTHROSCOPY WITH MEDIAL MENISECTOMY;  Surgeon: Nestor Lewandowsky, MD;  Location: [facility];  Service: Orthopedics;  Laterality: Left;  Marland Kitchen MULTIPLE TOOTH EXTRACTIONS  2015  . SPINAL CORD STIMULATOR IMPLANT  2008  . SPINAL CORD STIMULATOR IMPLANT  2008   revision  . UVULOPALATOPHARYNGOPLASTY  2000    Current Facility-Administered Medications  Medication Dose Route Frequency Provider Last Rate Last Dose  . [START ON 11/07/2017] ceFAZolin (ANCEF) 3 g in dextrose 5 % 50 mL IVPB  3 g Intravenous To SS-Surg August Saucer Corrie Mckusick, MD       Current Outpatient Medications  Medication Sig Dispense Refill Last Dose  . hydrOXYzine (ATARAX/VISTARIL) 10 MG tablet Take 10 mg by mouth 3 (three) times daily as needed for anxiety.     Marland Kitchen levothyroxine (SYNTHROID, LEVOTHROID) 112 MCG tablet Take 112 mcg by mouth daily before breakfast.    Taking  . liraglutide (VICTOZA) 18 MG/3ML SOPN Inject 1.2 mg into the skin every morning.     Marland Kitchen lisinopril-hydrochlorothiazide (PRINZIDE,ZESTORETIC)  20-25 MG per tablet Take 1 tablet by mouth daily.   Taking  . metFORMIN (GLUCOPHAGE) 500 MG tablet Take 1,000 mg by mouth 2 (two) times daily with a meal.    Taking  . morphine (MS CONTIN) 30 MG 12 hr tablet Take 30 mg by mouth every 8 (eight) hours as needed.  0   . oxycodone (ROXICODONE) 30 MG immediate release tablet Take 30 mg by mouth 2 (two) times daily as needed.  0 Taking  . pravastatin (PRAVACHOL) 40 MG tablet Take 40 mg by mouth daily.   Taking  . sertraline (ZOLOFT) 50 MG tablet Take 50 mg by mouth daily.     Marland Kitchen zolpidem (AMBIEN) 10 MG tablet Take 10 mg by mouth at bedtime as needed for sleep.   Taking   Allergies  Allergen Reactions  . Augmentin [Amoxicillin-Pot Clavulanate] Nausea And Vomiting    "Possible reaction"    Social History   Tobacco Use  . Smoking status: Current Every Day Smoker    Packs/day: 1.00  . Smokeless tobacco: Never Used  Substance Use Topics  . Alcohol use: Yes    Comment: rare    Family History  Problem Relation Age of Onset  . Clotting disorder Mother      Review of Systems  Musculoskeletal: Positive for joint pain.  All other systems reviewed and are negative.   Objective:  Physical Exam  Constitutional: He appears well-developed.  HENT:  Head: Normocephalic.  Eyes: Pupils are equal, round, and reactive to light.  Neck: Normal range of motion.  Cardiovascular: Normal rate.  Respiratory: Effort normal.  Neurological: He is alert.  Skin: Skin is warm.  Psychiatric: He has a normal mood and affect.  Left knee is examined.  Tenderness to palpation is present medial and lateral joint line.  Pedal pulses palpable.  Extensor mechanism is intact.  No groin pain with internal/external rotation of the leg.  No other masses lymphadenopathy or skin changes noted in that left leg region.  Skin is intact anteriorly.  Vital signs in last 24 hours:    Labs:   Estimated body mass index is 44.41 kg/m as calculated from the following:   Height  as of 10/31/17: 6\' 3"  (1.905 m).   Weight as of 10/31/17: 355 lb 4.8 oz (161.2 kg).   Imaging Review Plain radiographs demonstrate moderate degenerative joint disease of the left knee(s). The overall alignment isneutral. The bone quality appears to be good for age and reported activity level.   Preoperative templating of the joint replacement has been completed, documented, and submitted to the Operating Room personnel in order to optimize intra-operative equipment management.   Anticipated LOS equal to or greater than 2 midnights due to - Age 71 and older with one or more of the following:  - Obesity  - Expected need for hospital services (PT, OT, Nursing) required for safe  discharge  - Anticipated need for postoperative skilled nursing care or inpatient rehab  - Active co-morbidities: None OR   - Unanticipated findings during/Post Surgery: None  - Patient is a  high risk of re-admission due to: None     Assessment/Plan:  End stage arthritis, left knee   The patient history, physical examination, clinical judgment of the provider and imaging studies are consistent with end stage degenerative joint disease of the left knee(s) and total knee arthroplasty is deemed medically necessary. The treatment options including medical management, injection therapy arthroscopy and arthroplasty were discussed at length. The risks and benefits of total knee arthroplasty were presented and reviewed. The risks due to aseptic loosening, infection, stiffness, patella tracking problems, thromboembolic complications and other imponderables were discussed. The patient acknowledged the explanation, agreed to proceed with the plan and consent was signed. Patient is being admitted for inpatient treatment for surgery, pain control, PT, OT, prophylactic antibiotics, VTE prophylaxis, progressive ambulation and ADL's and discharge planning. The patient is planning to be discharged home with home health services the  patient has had exhaustive nonoperative treatment.  He has lost weight.  The patient will have significant difficulty with pain postoperatively due to his preoperative narcotic use.  He has tried to wean himself off of this medication to a large degree.  Nonetheless I would like for him to undergo catheter placement for anesthetic purposes just to help with pain control postoperatively.  I have talked with Dr. Arletha GrippeMozer about this and he is agreeable.  The risk and benefits of total knee replacement in a young patient like this he does have increased body mass index are also discussed.  Patient understands but cannot really continue to live in the amount of pain is in due to that left knee.  All questions answered.

## 2017-11-07 ENCOUNTER — Ambulatory Visit: Admit: 2017-11-07 | Payer: Medicare Other | Admitting: Orthopedic Surgery

## 2017-11-07 ENCOUNTER — Encounter (HOSPITAL_COMMUNITY)
Admission: AD | Disposition: A | Discharge status: Home Health | Payer: Self-pay | Source: Home / Self Care | Attending: Orthopedic Surgery

## 2017-11-07 ENCOUNTER — Encounter (HOSPITAL_COMMUNITY): Payer: Self-pay | Admitting: *Deleted

## 2017-11-07 ENCOUNTER — Inpatient Hospital Stay (HOSPITAL_COMMUNITY)
Admission: AD | Admit: 2017-11-07 | Discharge: 2017-11-11 | DRG: 470 | Disposition: A | Discharge status: Home Health | Payer: Medicare Other | Attending: Orthopedic Surgery | Admitting: Orthopedic Surgery

## 2017-11-07 ENCOUNTER — Other Ambulatory Visit: Payer: Self-pay

## 2017-11-07 ENCOUNTER — Ambulatory Visit (HOSPITAL_COMMUNITY): Payer: Medicare Other | Admitting: Certified Registered Nurse Anesthetist

## 2017-11-07 ENCOUNTER — Ambulatory Visit (HOSPITAL_COMMUNITY): Payer: Medicare Other | Admitting: Emergency Medicine

## 2017-11-07 DIAGNOSIS — Z96661 Presence of right artificial ankle joint: Secondary | ICD-10-CM | POA: Diagnosis present

## 2017-11-07 DIAGNOSIS — E785 Hyperlipidemia, unspecified: Secondary | ICD-10-CM | POA: Diagnosis present

## 2017-11-07 DIAGNOSIS — Z9889 Other specified postprocedural states: Secondary | ICD-10-CM | POA: Diagnosis not present

## 2017-11-07 DIAGNOSIS — E1142 Type 2 diabetes mellitus with diabetic polyneuropathy: Secondary | ICD-10-CM | POA: Diagnosis present

## 2017-11-07 DIAGNOSIS — M1710 Unilateral primary osteoarthritis, unspecified knee: Secondary | ICD-10-CM | POA: Diagnosis present

## 2017-11-07 DIAGNOSIS — E039 Hypothyroidism, unspecified: Secondary | ICD-10-CM | POA: Diagnosis present

## 2017-11-07 DIAGNOSIS — G8929 Other chronic pain: Secondary | ICD-10-CM | POA: Diagnosis present

## 2017-11-07 DIAGNOSIS — M1712 Unilateral primary osteoarthritis, left knee: Secondary | ICD-10-CM | POA: Diagnosis present

## 2017-11-07 DIAGNOSIS — Z972 Presence of dental prosthetic device (complete) (partial): Secondary | ICD-10-CM

## 2017-11-07 DIAGNOSIS — Z6841 Body Mass Index (BMI) 40.0 and over, adult: Secondary | ICD-10-CM | POA: Diagnosis not present

## 2017-11-07 DIAGNOSIS — Z880 Allergy status to penicillin: Secondary | ICD-10-CM

## 2017-11-07 DIAGNOSIS — G4730 Sleep apnea, unspecified: Secondary | ICD-10-CM | POA: Diagnosis present

## 2017-11-07 DIAGNOSIS — Z79890 Hormone replacement therapy (postmenopausal): Secondary | ICD-10-CM | POA: Diagnosis not present

## 2017-11-07 DIAGNOSIS — E1151 Type 2 diabetes mellitus with diabetic peripheral angiopathy without gangrene: Secondary | ICD-10-CM | POA: Diagnosis present

## 2017-11-07 DIAGNOSIS — I10 Essential (primary) hypertension: Secondary | ICD-10-CM | POA: Diagnosis present

## 2017-11-07 DIAGNOSIS — Z7984 Long term (current) use of oral hypoglycemic drugs: Secondary | ICD-10-CM

## 2017-11-07 DIAGNOSIS — F17210 Nicotine dependence, cigarettes, uncomplicated: Secondary | ICD-10-CM | POA: Diagnosis present

## 2017-11-07 HISTORY — PX: TOTAL KNEE ARTHROPLASTY: SHX125

## 2017-11-07 LAB — GLUCOSE, CAPILLARY
GLUCOSE-CAPILLARY: 104 mg/dL — AB (ref 70–99)
GLUCOSE-CAPILLARY: 151 mg/dL — AB (ref 70–99)
Glucose-Capillary: 146 mg/dL — ABNORMAL HIGH (ref 70–99)

## 2017-11-07 SURGERY — ARTHROPLASTY, KNEE, TOTAL
Anesthesia: Spinal | Laterality: Left

## 2017-11-07 SURGERY — ARTHROPLASTY, KNEE, TOTAL
Anesthesia: Regional | Site: Knee | Laterality: Left

## 2017-11-07 MED ORDER — ONDANSETRON HCL 4 MG PO TABS: 4.0000 mg | ORAL_TABLET | Freq: Four times a day (QID) | ORAL | Status: DC | PRN

## 2017-11-07 MED ORDER — CHLORHEXIDINE GLUCONATE 4 % EX LIQD: 60.0000 mL | Freq: Once | CUTANEOUS | Status: DC

## 2017-11-07 MED ORDER — SODIUM CHLORIDE 0.9% FLUSH
INTRAVENOUS | Status: DC | PRN
  Administered 2017-11-07: 20 mL

## 2017-11-07 MED ORDER — ONDANSETRON HCL 4 MG/2ML IJ SOLN: 4.0000 mg | Freq: Four times a day (QID) | INTRAMUSCULAR | Status: DC | PRN

## 2017-11-07 MED ORDER — PHENOL 1.4 % MT LIQD: 1.0000 | OROMUCOSAL | Status: DC | PRN

## 2017-11-07 MED ORDER — ROCURONIUM BROMIDE 100 MG/10ML IV SOLN
INTRAVENOUS | Status: DC | PRN
  Administered 2017-11-07: 80 mg via INTRAVENOUS

## 2017-11-07 MED ORDER — 0.9 % SODIUM CHLORIDE (POUR BTL) OPTIME
TOPICAL | Status: DC | PRN
  Administered 2017-11-07: 1000 mL

## 2017-11-07 MED ORDER — ROPIVACAINE HCL 2 MG/ML IJ SOLN
10.0000 mL/h | INTRAMUSCULAR | Status: DC
  Administered 2017-11-08 – 2017-11-09 (×3): 10 mL/h via PERINEURAL
  Filled 2017-11-07 (×7): qty 200

## 2017-11-07 MED ORDER — PROPOFOL 10 MG/ML IV BOLUS
INTRAVENOUS | Status: DC | PRN
  Administered 2017-11-07: 70 mg via INTRAVENOUS
  Administered 2017-11-07: 200 mg via INTRAVENOUS
  Administered 2017-11-07: 70 mg via INTRAVENOUS

## 2017-11-07 MED ORDER — OXYCODONE HCL 5 MG/5ML PO SOLN: 5.0000 mg | Freq: Once | ORAL | Status: DC | PRN

## 2017-11-07 MED ORDER — HYDROMORPHONE HCL 1 MG/ML IJ SOLN
0.2500 mg | INTRAMUSCULAR | Status: DC | PRN
  Administered 2017-11-07 (×4): 0.25 mg via INTRAVENOUS

## 2017-11-07 MED ORDER — HYDROMORPHONE HCL 1 MG/ML IJ SOLN
0.5000 mg | INTRAMUSCULAR | Status: DC | PRN
  Administered 2017-11-07: 0.5 mg via INTRAVENOUS

## 2017-11-07 MED ORDER — LIRAGLUTIDE 18 MG/3ML ~~LOC~~ SOPN
1.2000 mg | PEN_INJECTOR | SUBCUTANEOUS | Status: DC
  Administered 2017-11-09: 1.2 mg via SUBCUTANEOUS

## 2017-11-07 MED ORDER — TRANEXAMIC ACID 1000 MG/10ML IV SOLN
INTRAVENOUS | Status: DC | PRN
  Administered 2017-11-07: 2000 mg via TOPICAL

## 2017-11-07 MED ORDER — CLONIDINE HCL (ANALGESIA) 100 MCG/ML EP SOLN
EPIDURAL | Status: DC | PRN
  Administered 2017-11-07: 1.5 mL via INTRA_ARTICULAR

## 2017-11-07 MED ORDER — PRAVASTATIN SODIUM 40 MG PO TABS
40.0000 mg | ORAL_TABLET | Freq: Every day | ORAL | Status: DC
  Administered 2017-11-07 – 2017-11-11 (×5): 40 mg via ORAL
  Filled 2017-11-07 (×5): qty 1

## 2017-11-07 MED ORDER — MENTHOL 3 MG MT LOZG: 1.0000 | LOZENGE | OROMUCOSAL | Status: DC | PRN

## 2017-11-07 MED ORDER — OXYCODONE HCL 5 MG PO TABS: 10.0000 mg | ORAL_TABLET | Freq: Once | ORAL | Status: DC | PRN

## 2017-11-07 MED ORDER — PROPOFOL 1000 MG/100ML IV EMUL
INTRAVENOUS | Status: AC
  Filled 2017-11-07: qty 200

## 2017-11-07 MED ORDER — HYDROMORPHONE HCL 1 MG/ML IJ SOLN
INTRAMUSCULAR | Status: DC | PRN
  Administered 2017-11-07 (×2): 0.5 mg via INTRAVENOUS

## 2017-11-07 MED ORDER — KETAMINE HCL 10 MG/ML IJ SOLN
INTRAMUSCULAR | Status: DC | PRN
  Administered 2017-11-07: 20 mg via INTRAVENOUS
  Administered 2017-11-07: 10 mg via INTRAVENOUS
  Administered 2017-11-07: 20 mg via INTRAVENOUS

## 2017-11-07 MED ORDER — CLONIDINE HCL (ANALGESIA) 100 MCG/ML EP SOLN
150.0000 ug | Freq: Once | EPIDURAL | Status: DC
  Filled 2017-11-07: qty 10

## 2017-11-07 MED ORDER — ACETAMINOPHEN 325 MG PO TABS
325.0000 mg | ORAL_TABLET | Freq: Four times a day (QID) | ORAL | Status: DC | PRN
  Administered 2017-11-07: 650 mg via ORAL
  Filled 2017-11-07: qty 2

## 2017-11-07 MED ORDER — MIDAZOLAM HCL 2 MG/2ML IJ SOLN
INTRAMUSCULAR | Status: AC
  Filled 2017-11-07: qty 4

## 2017-11-07 MED ORDER — HYDROMORPHONE HCL 1 MG/ML IJ SOLN
INTRAMUSCULAR | Status: AC
  Filled 2017-11-07: qty 1

## 2017-11-07 MED ORDER — METHOCARBAMOL 1000 MG/10ML IJ SOLN
500.0000 mg | Freq: Four times a day (QID) | INTRAVENOUS | Status: DC | PRN
  Filled 2017-11-07: qty 5

## 2017-11-07 MED ORDER — PROPOFOL 500 MG/50ML IV EMUL
INTRAVENOUS | Status: DC | PRN
  Administered 2017-11-07: 10 ug/kg/min via INTRAVENOUS

## 2017-11-07 MED ORDER — TRANEXAMIC ACID 1000 MG/10ML IV SOLN
2000.0000 mg | Freq: Once | INTRAVENOUS | Status: DC
  Filled 2017-11-07: qty 20

## 2017-11-07 MED ORDER — METHOCARBAMOL 500 MG PO TABS
500.0000 mg | ORAL_TABLET | Freq: Four times a day (QID) | ORAL | Status: DC | PRN
  Administered 2017-11-07 – 2017-11-11 (×7): 500 mg via ORAL
  Filled 2017-11-07 (×7): qty 1

## 2017-11-07 MED ORDER — ROPIVACAINE HCL 7.5 MG/ML IJ SOLN
INTRAMUSCULAR | Status: DC | PRN
  Administered 2017-11-07: 20 mL via PERINEURAL

## 2017-11-07 MED ORDER — METOCLOPRAMIDE HCL 5 MG/ML IJ SOLN: 5.0000 mg | Freq: Three times a day (TID) | INTRAMUSCULAR | Status: DC | PRN

## 2017-11-07 MED ORDER — LISINOPRIL-HYDROCHLOROTHIAZIDE 20-25 MG PO TABS: 1.0000 | ORAL_TABLET | Freq: Every day | ORAL | Status: DC

## 2017-11-07 MED ORDER — BUPIVACAINE LIPOSOME 1.3 % IJ SUSP
INTRAMUSCULAR | Status: DC | PRN
  Administered 2017-11-07: 20 mL

## 2017-11-07 MED ORDER — METFORMIN HCL 500 MG PO TABS
1000.0000 mg | ORAL_TABLET | Freq: Two times a day (BID) | ORAL | Status: DC
Start: 2017-11-07 — End: 2017-11-11
  Administered 2017-11-07 – 2017-11-11 (×8): 1000 mg via ORAL
  Filled 2017-11-07 (×8): qty 2

## 2017-11-07 MED ORDER — FENTANYL CITRATE (PF) 250 MCG/5ML IJ SOLN
INTRAMUSCULAR | Status: AC
  Filled 2017-11-07: qty 5

## 2017-11-07 MED ORDER — OXYCODONE HCL 5 MG PO TABS
10.0000 mg | ORAL_TABLET | ORAL | Status: DC | PRN
  Administered 2017-11-07 – 2017-11-09 (×8): 15 mg via ORAL
  Administered 2017-11-10 (×2): 10 mg via ORAL
  Administered 2017-11-10: 15 mg via ORAL
  Administered 2017-11-10: 10 mg via ORAL
  Administered 2017-11-10 – 2017-11-11 (×2): 15 mg via ORAL
  Filled 2017-11-07: qty 3
  Filled 2017-11-07: qty 2
  Filled 2017-11-07 (×10): qty 3
  Filled 2017-11-07 (×2): qty 2

## 2017-11-07 MED ORDER — KETAMINE HCL 50 MG/5ML IJ SOSY
PREFILLED_SYRINGE | INTRAMUSCULAR | Status: AC
  Filled 2017-11-07: qty 5

## 2017-11-07 MED ORDER — SUGAMMADEX SODIUM 500 MG/5ML IV SOLN
INTRAVENOUS | Status: DC | PRN
  Administered 2017-11-07: 320 mg via INTRAVENOUS

## 2017-11-07 MED ORDER — BUPIVACAINE LIPOSOME 1.3 % IJ SUSP
20.0000 mL | Freq: Once | INTRAMUSCULAR | Status: DC
  Filled 2017-11-07: qty 20

## 2017-11-07 MED ORDER — BUPIVACAINE HCL 0.25 % IJ SOLN
INTRAMUSCULAR | Status: DC | PRN
  Administered 2017-11-07: 30 mL via INTRA_ARTICULAR

## 2017-11-07 MED ORDER — SERTRALINE HCL 50 MG PO TABS
50.0000 mg | ORAL_TABLET | Freq: Every day | ORAL | Status: DC
  Administered 2017-11-08 – 2017-11-11 (×4): 50 mg via ORAL
  Filled 2017-11-07 (×4): qty 1

## 2017-11-07 MED ORDER — GABAPENTIN 300 MG PO CAPS
300.0000 mg | ORAL_CAPSULE | Freq: Three times a day (TID) | ORAL | Status: DC
  Administered 2017-11-07 – 2017-11-11 (×12): 300 mg via ORAL
  Filled 2017-11-07 (×12): qty 1

## 2017-11-07 MED ORDER — RIVAROXABAN 10 MG PO TABS
10.0000 mg | ORAL_TABLET | Freq: Every day | ORAL | Status: DC
  Administered 2017-11-07 – 2017-11-10 (×4): 10 mg via ORAL
  Filled 2017-11-07 (×4): qty 1

## 2017-11-07 MED ORDER — FENTANYL CITRATE (PF) 250 MCG/5ML IJ SOLN
INTRAMUSCULAR | Status: DC | PRN
  Administered 2017-11-07: 100 ug via INTRAVENOUS
  Administered 2017-11-07 (×3): 50 ug via INTRAVENOUS

## 2017-11-07 MED ORDER — ONDANSETRON HCL 4 MG/2ML IJ SOLN
INTRAMUSCULAR | Status: DC | PRN
  Administered 2017-11-07: 4 mg via INTRAVENOUS

## 2017-11-07 MED ORDER — LACTATED RINGERS IV SOLN
INTRAVENOUS | Status: AC
  Administered 2017-11-07: 17:00:00 via INTRAVENOUS

## 2017-11-07 MED ORDER — LEVOTHYROXINE SODIUM 112 MCG PO TABS
112.0000 ug | ORAL_TABLET | Freq: Every day | ORAL | Status: DC
  Administered 2017-11-08 – 2017-11-11 (×4): 112 ug via ORAL
  Filled 2017-11-07 (×4): qty 1

## 2017-11-07 MED ORDER — BUPIVACAINE HCL (PF) 0.25 % IJ SOLN
INTRAMUSCULAR | Status: AC
  Filled 2017-11-07: qty 30

## 2017-11-07 MED ORDER — LISINOPRIL 20 MG PO TABS
20.0000 mg | ORAL_TABLET | Freq: Every day | ORAL | Status: DC
  Administered 2017-11-08 – 2017-11-11 (×4): 20 mg via ORAL
  Filled 2017-11-07 (×5): qty 1

## 2017-11-07 MED ORDER — HYDROCHLOROTHIAZIDE 25 MG PO TABS
25.0000 mg | ORAL_TABLET | Freq: Every day | ORAL | Status: DC
  Administered 2017-11-07 – 2017-11-11 (×5): 25 mg via ORAL
  Filled 2017-11-07 (×5): qty 1

## 2017-11-07 MED ORDER — LACTATED RINGERS IV SOLN
INTRAVENOUS | Status: DC | PRN
  Administered 2017-11-07 (×2): via INTRAVENOUS

## 2017-11-07 MED ORDER — HYDROXYZINE HCL 10 MG PO TABS
10.0000 mg | ORAL_TABLET | Freq: Three times a day (TID) | ORAL | Status: DC | PRN
  Filled 2017-11-07: qty 1

## 2017-11-07 MED ORDER — ROPIVACAINE HCL 2 MG/ML IJ SOLN
INTRAMUSCULAR | Status: DC | PRN
  Administered 2017-11-07: 200 mL
  Administered 2017-11-07: 10 mL/h via EPIDURAL

## 2017-11-07 MED ORDER — DOCUSATE SODIUM 100 MG PO CAPS
100.0000 mg | ORAL_CAPSULE | Freq: Two times a day (BID) | ORAL | Status: DC
  Administered 2017-11-07 – 2017-11-11 (×6): 100 mg via ORAL
  Filled 2017-11-07 (×7): qty 1

## 2017-11-07 MED ORDER — PHENYLEPHRINE 40 MCG/ML (10ML) SYRINGE FOR IV PUSH (FOR BLOOD PRESSURE SUPPORT)
PREFILLED_SYRINGE | INTRAVENOUS | Status: DC | PRN
  Administered 2017-11-07 (×3): 80 ug via INTRAVENOUS

## 2017-11-07 MED ORDER — ZOLPIDEM TARTRATE 5 MG PO TABS: 10.0000 mg | ORAL_TABLET | Freq: Every evening | ORAL | Status: DC | PRN

## 2017-11-07 MED ORDER — MORPHINE SULFATE (PF) 4 MG/ML IV SOLN
INTRAVENOUS | Status: AC
  Filled 2017-11-07: qty 2

## 2017-11-07 MED ORDER — METOCLOPRAMIDE HCL 5 MG PO TABS: 5.0000 mg | ORAL_TABLET | Freq: Three times a day (TID) | ORAL | Status: DC | PRN

## 2017-11-07 MED ORDER — MORPHINE SULFATE ER 30 MG PO TBCR
30.0000 mg | EXTENDED_RELEASE_TABLET | Freq: Three times a day (TID) | ORAL | Status: DC | PRN
  Administered 2017-11-07 – 2017-11-11 (×6): 30 mg via ORAL
  Filled 2017-11-07 (×6): qty 1

## 2017-11-07 MED ORDER — MORPHINE SULFATE (PF) 4 MG/ML IV SOLN
INTRAVENOUS | Status: DC | PRN
  Administered 2017-11-07: 8 mg via SUBCUTANEOUS

## 2017-11-07 MED ORDER — DEXMEDETOMIDINE HCL 200 MCG/2ML IV SOLN
INTRAVENOUS | Status: DC | PRN
  Administered 2017-11-07 (×3): 8 ug via INTRAVENOUS

## 2017-11-07 MED ORDER — MIDAZOLAM HCL 2 MG/2ML IJ SOLN
INTRAMUSCULAR | Status: DC | PRN
  Administered 2017-11-07 (×2): 2 mg via INTRAVENOUS

## 2017-11-07 SURGICAL SUPPLY — 76 items
BAG DECANTER FOR FLEXI CONT (MISCELLANEOUS) ×3 IMPLANT
BANDAGE ESMARK 6X9 LF (GAUZE/BANDAGES/DRESSINGS) ×1 IMPLANT
BLADE SAG 18X100X1.27 (BLADE) ×3 IMPLANT
BLADE SAW SGTL 13.0X1.19X90.0M (BLADE) IMPLANT
BNDG COHESIVE 6X5 TAN STRL LF (GAUZE/BANDAGES/DRESSINGS) ×3 IMPLANT
BNDG ELASTIC 6X15 VLCR STRL LF (GAUZE/BANDAGES/DRESSINGS) ×3 IMPLANT
BNDG ESMARK 6X9 LF (GAUZE/BANDAGES/DRESSINGS) ×3
BOWL SMART MIX CTS (DISPOSABLE) IMPLANT
CLOSURE WOUND 1/2 X4 (GAUZE/BANDAGES/DRESSINGS) ×1
COMPONENT TRI CR RETAIN SZ6 LT (Orthopedic Implant) ×1 IMPLANT
CONT SPECI 4OZ STER CLIK (MISCELLANEOUS) ×3 IMPLANT
COVER SURGICAL LIGHT HANDLE (MISCELLANEOUS) ×3 IMPLANT
CUFF TOURNIQUET SINGLE 34IN LL (TOURNIQUET CUFF) ×3 IMPLANT
CUFF TOURNIQUET SINGLE 44IN (TOURNIQUET CUFF) IMPLANT
DECANTER SPIKE VIAL GLASS SM (MISCELLANEOUS) ×3 IMPLANT
DRAPE INCISE IOBAN 66X45 STRL (DRAPES) IMPLANT
DRAPE ORTHO SPLIT 77X108 STRL (DRAPES) ×6
DRAPE SURG ORHT 6 SPLT 77X108 (DRAPES) ×3 IMPLANT
DRAPE U-SHAPE 47X51 STRL (DRAPES) ×3 IMPLANT
DRSG AQUACEL AG ADV 3.5X14 (GAUZE/BANDAGES/DRESSINGS) ×3 IMPLANT
DURAPREP 26ML APPLICATOR (WOUND CARE) ×6 IMPLANT
ELECT CAUTERY BLADE 6.4 (BLADE) ×3 IMPLANT
ELECT REM PT RETURN 9FT ADLT (ELECTROSURGICAL) ×3
ELECTRODE REM PT RTRN 9FT ADLT (ELECTROSURGICAL) ×1 IMPLANT
GAUZE SPONGE 4X4 12PLY STRL (GAUZE/BANDAGES/DRESSINGS) ×3 IMPLANT
GLOVE BIOGEL PI IND STRL 7.5 (GLOVE) ×1 IMPLANT
GLOVE BIOGEL PI IND STRL 8 (GLOVE) ×1 IMPLANT
GLOVE BIOGEL PI INDICATOR 7.5 (GLOVE) ×2
GLOVE BIOGEL PI INDICATOR 8 (GLOVE) ×2
GLOVE ECLIPSE 7.0 STRL STRAW (GLOVE) ×3 IMPLANT
GLOVE SURG ORTHO 8.0 STRL STRW (GLOVE) ×3 IMPLANT
GOWN STRL REUS W/ TWL LRG LVL3 (GOWN DISPOSABLE) ×3 IMPLANT
GOWN STRL REUS W/TWL LRG LVL3 (GOWN DISPOSABLE) ×6
HANDPIECE INTERPULSE COAX TIP (DISPOSABLE) ×2
HOOD PEEL AWAY FLYTE STAYCOOL (MISCELLANEOUS) ×9 IMPLANT
IMMOBILIZER KNEE 20 (SOFTGOODS) IMPLANT
IMMOBILIZER KNEE 22 UNIV (SOFTGOODS) ×3 IMPLANT
IMMOBILIZER KNEE 24 THIGH 36 (MISCELLANEOUS) ×1 IMPLANT
IMMOBILIZER KNEE 24 UNIV (MISCELLANEOUS) ×3
INSERT BEARNG TRI TIB SZ7 KNEE (Orthopedic Implant) ×1 IMPLANT
KIT BASIN OR (CUSTOM PROCEDURE TRAY) ×3 IMPLANT
KIT TURNOVER KIT B (KITS) ×3 IMPLANT
KNEE TIBIAL COMPONENT SZ7 (Knees) ×3 IMPLANT
MANIFOLD NEPTUNE II (INSTRUMENTS) ×3 IMPLANT
MARKER SKIN DUAL TIP RULER LAB (MISCELLANEOUS) ×3 IMPLANT
NDL SAFETY ECLIPSE 18X1.5 (NEEDLE) ×1 IMPLANT
NEEDLE 18GX1X1/2 (RX/OR ONLY) (NEEDLE) ×3 IMPLANT
NEEDLE 22X1 1/2 (OR ONLY) (NEEDLE) ×6 IMPLANT
NEEDLE HYPO 18GX1.5 SHARP (NEEDLE) ×2
NS IRRIG 1000ML POUR BTL (IV SOLUTION) ×6 IMPLANT
PACK TOTAL JOINT (CUSTOM PROCEDURE TRAY) ×3 IMPLANT
PAD ARMBOARD 7.5X6 YLW CONV (MISCELLANEOUS) ×6 IMPLANT
PAD CAST 4YDX4 CTTN HI CHSV (CAST SUPPLIES) ×1 IMPLANT
PADDING CAST COTTON 4X4 STRL (CAST SUPPLIES) ×2
PADDING CAST COTTON 6X4 STRL (CAST SUPPLIES) ×6 IMPLANT
PATELLA ASYMMETRIC 38X11 KNEE (Orthopedic Implant) ×3 IMPLANT
SET HNDPC FAN SPRY TIP SCT (DISPOSABLE) ×1 IMPLANT
STRIP CLOSURE SKIN 1/2X4 (GAUZE/BANDAGES/DRESSINGS) ×2 IMPLANT
SUCTION FRAZIER HANDLE 10FR (MISCELLANEOUS) ×2
SUCTION TUBE FRAZIER 10FR DISP (MISCELLANEOUS) ×1 IMPLANT
SUT MNCRL AB 3-0 PS2 18 (SUTURE) ×3 IMPLANT
SUT VIC AB 0 CT1 27 (SUTURE) ×6
SUT VIC AB 0 CT1 27XBRD ANBCTR (SUTURE) ×3 IMPLANT
SUT VIC AB 1 CT1 27 (SUTURE) ×10
SUT VIC AB 1 CT1 27XBRD ANBCTR (SUTURE) ×5 IMPLANT
SUT VIC AB 2-0 CT1 27 (SUTURE) ×8
SUT VIC AB 2-0 CT1 TAPERPNT 27 (SUTURE) ×4 IMPLANT
SYR 30ML LL (SYRINGE) ×9 IMPLANT
SYR 3ML LL SCALE MARK (SYRINGE) ×3 IMPLANT
SYR TB 1ML LUER SLIP (SYRINGE) ×3 IMPLANT
TOWEL OR 17X24 6PK STRL BLUE (TOWEL DISPOSABLE) ×6 IMPLANT
TOWEL OR 17X26 10 PK STRL BLUE (TOWEL DISPOSABLE) ×6 IMPLANT
TRAY CATH 16FR W/PLASTIC CATH (SET/KITS/TRAYS/PACK) IMPLANT
TRI TIB BEAR INSERT SZ7 KNEE (Orthopedic Implant) ×3 IMPLANT
TRIATH CRUCIATE RETAIN SZ6 KNE (Orthopedic Implant) ×3 IMPLANT
WATER STERILE IRR 1000ML POUR (IV SOLUTION) IMPLANT

## 2017-11-07 NOTE — Anesthesia Procedure Notes (Signed)
Procedure Name: Intubation Date/Time: 11/07/2017 8:00 AM Performed by: Gayland Curry, CRNA Pre-anesthesia Checklist: Patient identified, Emergency Drugs available, Suction available and Patient being monitored Patient Re-evaluated:Patient Re-evaluated prior to induction Oxygen Delivery Method: Circle system utilized Preoxygenation: Pre-oxygenation with 100% oxygen Induction Type: IV induction Ventilation: Mask ventilation without difficulty and Oral airway inserted - appropriate to patient size Laryngoscope Size: Mac and 4 Grade View: Grade I Tube type: Oral Tube size: 7.0 mm Number of attempts: 1 Intubation method: patient postioned with 2 blankets under shoulders. Placement Confirmation: ETT inserted through vocal cords under direct vision,  positive ETCO2 and breath sounds checked- equal and bilateral Secured at: 23 cm Tube secured with: Tape Dental Injury: Teeth and Oropharynx as per pre-operative assessment

## 2017-11-07 NOTE — Interval H&P Note (Signed)
History and Physical Interval Note:  11/07/2017 7:24 AM  Adam [name]  has presented today for surgery, with the diagnosis of LEFT KNEE OSTEOARTHRITIS  The various methods of treatment have been discussed with the patient and family. After consideration of risks, benefits and other options for treatment, the patient has consented to  Procedure(s): LEFT TOTAL KNEE ARTHROPLASTY (Left) as a surgical intervention .  The patient's history has been reviewed, patient examined, no change in status, stable for surgery.  I have reviewed the patient's chart and labs.  Questions were answered to the patient's satisfaction.     Burnard BuntingG Scott Lenzy Kerschner

## 2017-11-07 NOTE — Progress Notes (Signed)
Orthopedic Tech Progress Note [facility] Details:  [name] 01/23/1966 [id number]  CPM Left Knee CPM Left Knee: On Left Knee Flexion (Degrees): 40 Left Knee Extension (Degrees): 0 Additional Comments: trapeze bar patient helper Viewed order from doctor's order list Post Interventions Patient Tolerated: Well Instructions Provided: Care of device  Adam [name] 11/07/2017, 11:12 AM

## 2017-11-07 NOTE — Progress Notes (Addendum)
Pharmacy note  52 yo male with left knee arthritis s/p TKR to begin Xarelto for VTE prophylaxis. Okay to start tonight per MD -SCr= 0.8 and hg= 14.8 on 10/31/2016 -ropivacaine is infusing in the femoral catheter as a nerve block (Dr. August Saucerean has spoken with anesthesiology and okay to start Xarelto tonight)  Plan -Xarelto 10mg  po qday (start tonight) -CBC and BMET in am  Harland GermanAndrew Peighton Edgin, PharmD Clinical Pharmacist Please check Amion for pharmacy contact number

## 2017-11-07 NOTE — Anesthesia Procedure Notes (Signed)
Anesthesia Regional Block: Femoral nerve block   Pre-Anesthetic Checklist: ,, timeout performed, Correct Patient, Correct Site, Correct Laterality, Correct Procedure, Correct Position, site marked, Risks and benefits discussed,  Surgical consent,  Pre-op evaluation,  At surgeon's request and post-op pain management  Laterality: Lower and Left  Prep: chloraprep       Needles:  Injection technique: Catheter  Needle Type: Tuohy        Needle insertion depth: 7 cm  Catheter at skin depth: 20 cm  Additional Needles:   Procedures:, nerve stimulator,,,,,,,   Nerve Stimulator or Paresthesia:  Response: quad, 0.4 mA,   Additional Responses:   Narrative:  Start time: 11/07/2017 7:13 AM End time: 11/07/2017 7:23 AM Injection made incrementally with aspirations every 5 mL.  Performed by: Personally  Anesthesiologist: Val EagleMoser, Haji Delaine, MD  Additional Notes: H+P and labs reviewed, risks and benefits discussed with patient, procedure tolerated well without complications

## 2017-11-07 NOTE — Evaluation (Signed)
Physical Therapy Evaluation Patient Details Name: Adam [name] MRN: [id number] DOB: 1966-02-03 Today's Date: 11/07/2017   History of Present Illness  Pt is a 52 y/o male s/p elective L TKA. Pt also with continuous femoral nerve block. PMH includes HTN, DVT, tobacco abuse, spinal cord stimulator implant.   Clinical Impression  Pt s/p surgery above with deficits below. Pt requiring min A to perform basic bed mobility tasks. Pt with continuous femoral nerve block and PT did not have 2nd person to assist, so felt it was unsafe to attempt further mobility, given pt's LLE was numb with poor muscle control. Reports plan is to d/c home with family. Will continue to follow acutely to maximize functional mobility independence and safety.     Follow Up Recommendations Follow surgeon's recommendation for DC plan and follow-up therapies;Supervision for mobility/OOB    Equipment Recommendations  None recommended by PT    Recommendations for Other Services OT consult     Precautions / Restrictions Precautions Precautions: Knee;Fall;Other (comment) Precaution Booklet Issued: Yes (comment) Precaution Comments: Reviewed knee precautions and supine HEP. Pt also with continuous femoral nerve block.  Required Braces or Orthoses: Knee Immobilizer - Left Knee Immobilizer - Left: Other (comment)(until discontinued ) Restrictions Weight Bearing Restrictions: Yes LLE Weight Bearing: Weight bearing as tolerated      Mobility  Bed Mobility Overal bed mobility: Needs Assistance Bed Mobility: Supine to Sit     Supine to sit: Min assist     General bed mobility comments: Min A for LLE management. Increased time and effort required to sit at EOB and pt heavily reliant on use of bed rails. Given continued block, did not feel safe to attempt further mobility without second person to assist.   Transfers                 General transfer comment: deferred   Ambulation/Gait                 Stairs            Wheelchair Mobility    Modified Rankin (Stroke Patients Only)       Balance Overall balance assessment: Needs assistance Sitting-balance support: No upper extremity supported;Feet supported Sitting balance-Leahy Scale: Good                                       Pertinent Vitals/Pain Pain Assessment: Faces Faces Pain Scale: Hurts whole lot Pain Location: L knee  Pain Descriptors / Indicators: Aching;Operative site guarding Pain Intervention(s): Limited activity within patient's tolerance;Monitored during session;Repositioned    Home Living Family/patient expects to be discharged to:: Unsure Living Arrangements: Other relatives Available Help at Discharge: Family;Available PRN/intermittently Type of Home: House Home Access: Level entry     Home Layout: Two level;Able to live on main level with bedroom/bathroom Home Equipment: Dan Humphreys - 2 wheels;Bedside commode;Shower seat;Cane - single point      Prior Function Level of Independence: Independent with assistive device(s)         Comments: Used cane for ambulation      Hand Dominance        Extremity/Trunk Assessment   Upper Extremity Assessment Upper Extremity Assessment: Defer to OT evaluation    Lower Extremity Assessment Lower Extremity Assessment: LLE deficits/detail;RLE deficits/detail RLE Deficits / Details: Decreased ankle ROM at baseline secondary to previous injury. Pt reports he walks with toe out gait secondary to  previous injury.  LLE Deficits / Details: Reports numbness in LLE from ankle to upper thigh. Able to perform ther ex below. Deficits consistent with post op pain and weakness.     Cervical / Trunk Assessment Cervical / Trunk Assessment: Normal  Communication   Communication: No difficulties  Cognition Arousal/Alertness: Awake/alert Behavior During Therapy: WFL for tasks assessed/performed Overall Cognitive Status: Within Functional Limits for  tasks assessed                                        General Comments      Exercises Total Joint Exercises Ankle Circles/Pumps: AROM;Left;20 reps(unable to perform on RLE secondary to previous injury. ) Quad Sets: AROM;Left;10 reps Heel Slides: AAROM;Left;10 reps   Assessment/Plan    PT Assessment Patient needs continued PT services  PT Problem List Decreased strength;Decreased range of motion;Decreased balance;Decreased mobility;Decreased knowledge of use of DME;Pain;Decreased knowledge of precautions;Impaired sensation       PT Treatment Interventions DME instruction;Gait training;Therapeutic activities;Functional mobility training;Therapeutic exercise;Balance training;Patient/family education    PT Goals (Current goals can be found in the Care Plan section)  Acute Rehab PT Goals Patient Stated Goal: "to decrease pain"  PT Goal Formulation: With patient Time For Goal Achievement: 11/21/17 Potential to Achieve Goals: Good    Frequency 7X/week   Barriers to discharge        Co-evaluation               AM-PAC PT "6 Clicks" Daily Activity  Outcome Measure Difficulty turning over in bed (including adjusting bedclothes, sheets and blankets)?: A Lot Difficulty moving from lying on back to sitting on the side of the bed? : Unable Difficulty sitting down on and standing up from a chair with arms (e.g., wheelchair, bedside commode, etc,.)?: Unable Help needed moving to and from a bed to chair (including a wheelchair)?: A Lot Help needed walking in hospital room?: Total Help needed climbing 3-5 steps with a railing? : Total 6 Click Score: 8    End of Session Equipment Utilized During Treatment: Gait belt Activity Tolerance: Patient tolerated treatment well Patient left: in bed;with call bell/phone within reach;with bed alarm set;with nursing/sitter in room(sitting EOB; RN notified and cleared ) Nurse Communication: Mobility status PT Visit Diagnosis:  Unsteadiness on feet (R26.81);Other abnormalities of gait and mobility (R26.89);Other symptoms and signs involving the nervous system (R29.898);Pain Pain - Right/Left: Left Pain - part of body: Knee    Time: [id number] PT Time Calculation (min) (ACUTE ONLY): 20 min   Charges:   PT Evaluation $PT Eval Moderate Complexity: 1 Mod          Gladys DammeBrittany Shereda Graw, PT, DPT  Acute Rehabilitation Services  Pager: [phone]   Lehman PromBrittany S Baneen Wieseler 11/07/2017, 5:34 PM

## 2017-11-07 NOTE — Progress Notes (Signed)
Patient has history of superficial DVT in left leg several years ago.  Instead of aspirin I will use Xarelto for DVT prophylaxis and check him with ultrasound on Thursday for blood clot to make sure that is not present prior to discharge on Friday

## 2017-11-07 NOTE — Transfer of Care (Signed)
Immediate Anesthesia Transfer of Care Note  Patient: Adam [name]  Procedure(s) Performed: LEFT TOTAL KNEE ARTHROPLASTY (Left Knee)  Patient Location: PACU  Anesthesia Type:General  Level of Consciousness: awake, alert  and oriented  Airway & Oxygen Therapy: Patient Spontanous Breathing and Patient connected to face mask oxygen  Post-op Assessment: Report given to RN and Post -op Vital signs reviewed and stable  Post vital signs: Reviewed and stable  Last Vitals:  Vitals Value Taken Time  BP 102/71 11/07/2017 10:38 AM  Temp    Pulse 82 11/07/2017 10:43 AM  Resp 16 11/07/2017 10:43 AM  SpO2 98 % 11/07/2017 10:43 AM  Vitals shown include unvalidated device data.  Last Pain:  Vitals:   11/07/17 0615  TempSrc: Oral  PainSc:          Complications: No apparent anesthesia complications

## 2017-11-07 NOTE — Brief Op Note (Signed)
11/07/2017  10:44 AM  PATIENT:  Adam [name]  52 y.o. male  PRE-OPERATIVE DIAGNOSIS:  LEFT KNEE OSTEOARTHRITIS  POST-OPERATIVE DIAGNOSIS:  LEFT KNEE OSTEOARTHRITIS  PROCEDURE:  Procedure(s): LEFT TOTAL KNEE ARTHROPLASTY  SURGEON:  Surgeon(s): Cammy Copaean, Gregory Scott, MD  ASSISTANT: Hart CarwinJustin queen rnfa  ANESTHESIA:   general  EBL: 50 ml    Total I/O In: 1200 [I.V.:1200] Out: 50 [Blood:50]  BLOOD ADMINISTERED: none  DRAINS: none   LOCAL MEDICATIONS USED: Marcaine morphine clonidine Exparel  SPECIMEN:  No Specimen  COUNTS:  YES  TOURNIQUET:   Total Tourniquet Time Documented: Thigh (Left) - 84 minutes Total: Thigh (Left) - 84 minutes   DICTATION: .Other Dictation: Dictation Number [id number]  PLAN OF CARE: Admit to inpatient   PATIENT DISPOSITION:  PACU - hemodynamically stable

## 2017-11-07 NOTE — Op Note (Signed)
NAME: Adam [name], Adam K. MEDICAL RECORD [id number] ACCOUNT [id number] DATE OF BIRTH:[birth year] FACILITY: [facility] LOCATION: [facility] PHYSICIAN:GREGORY Diamantina ProvidenceS. DEAN, MD  OPERATIVE REPORT  DATE OF PROCEDURE:  11/07/2017  PREOPERATIVE DIAGNOSIS:  Left knee arthritis.  POSTOPERATIVE DIAGNOSIS:  Left knee arthritis.  PROCEDURE:  Left total knee replacement using Stryker press-fit Triathlon components size 6 femur, 7 tibia, 9 mm polyethylene deep dish posterior cruciate retaining insert with 38 mm, 3 peg press fit patella.  The femoral prosthesis also PCL sparing.  SURGEON:  Cammy CopaGregory Scott Dean, MD  ASSISTANT:  Melvyn NethJustin Clean, RNFA.  INDICATIONS:  [name] is a 52 year old patient with end-stage left knee arthritis refractory to nonoperative measures, who presents for operative management after explanation of risks and benefits.  PROCEDURE IN DETAIL:  The patient was brought to the operating room where general endotracheal anesthesia was induced.  Preoperative antibiotics administered.  Timeout was called.  Left leg was prescrubbed with alcohol and Betadine and allowed to air  dry, prepped with DuraPrep solution and draped in a sterile manner.  Ioban used to cover the operative field.  Leg was then elevated and the extremity with the Esmarch wrap tourniquet was inflated.  Median parapatellar approach to the left knee was made.   Skin and subcutaneous tissue were sharply divided.  A median parapatellar approach was made and marked with a #1 Vicryl suture.  The patient had severe patellofemoral arthritis as well as significant moderately severe arthritis in the lateral  compartment, particularly on the lateral femoral condyle.  The ACL was released.  Fat pad partially excised.  Minimal medial dissection was performed due to the early valgus arthritis of this left knee.  The lateral patellofemoral ligament was released.   Intramedullary alignment was then used to make a cut perpendicular to the mechanical axis  of the tibia.  This was measured at 9 mm off the least affected medial side.  This gave about a 2 mm cut off of the most affected posterolateral portion.   Collaterals and posterior neurovascular structures were protected.  At this time, initial cut of 8 mm was made off the distal femur in 5 degrees of valgus.  This did not allow for full extension with a 9 mm spacer.  Additional 2 mm was cut, which allowed  for full extension with a 9 mm spacer.  The femur was then sized to a size 6.  Anterior, posterior and chamfer cuts were made.  The tibia was then keel punched.  Trial components were placed.  The patella was then cut from 28 mm down to 16.  Three peg  trial patella was placed.  The patient achieved full extension, full flexion with excellent patellar tracking.  At this time, trial components were removed and the tibial preparation was completed.  Then, thorough irrigation was performed with 3 liters  of irrigating solution followed by numbing medicine with Exparel, Marcaine and saline.  Then a tranexamic acid sponge was allowed to soak in the knee for 3 minutes.  This was removed and then the true components were tapped into position.  The patient's  bone quality was excellent.  Following this, the spacer was placed.  Tourniquet was released and bleeding points encountered were controlled using electrocautery.  Thorough irrigation again performed.  At this time, the median parapatellar approach was  closed with the knee over a bolster using #1 Vicryl suture followed by interrupted inverted 2-0 Vicryl suture, 0 Vicryl suture, 2-0 Vicryl suture and a 3-0 Monocryl.  Steri-Strips, Aquacel and dressing  and knee immobilizer placed.    The patient tolerated the procedure well, without immediate complications, transferred to the recovery room in stable condition.  AN/NUANCE  D:11/07/2017 T:11/07/2017 JOB:[id number]

## 2017-11-07 NOTE — Anesthesia Preprocedure Evaluation (Signed)
Anesthesia Evaluation  Patient identified by MRN, date of birth, ID band Patient awake    Reviewed: Allergy & Precautions, NPO status , Patient's Chart, lab work & pertinent test results  History of Anesthesia Complications Negative for: history of anesthetic complications  Airway Mallampati: II  TM Distance: >3 FB Neck ROM: Full    Dental  (+) Edentulous Upper, Edentulous Lower   Pulmonary sleep apnea , Current Smoker,    breath sounds clear to auscultation       Cardiovascular hypertension, Pt. on medications + Peripheral Vascular Disease   Rhythm:Regular     Neuro/Psych Spinal cord stimulator (off per patient) negative psych ROS   GI/Hepatic Neg liver ROS, GERD  ,  Endo/Other  diabetes, Type 2, Oral Hypoglycemic AgentsHypothyroidism Morbid obesity  Renal/GU negative Renal ROS     Musculoskeletal  (+) Arthritis ,   Abdominal   Peds  Hematology negative hematology ROS (+)   Anesthesia Other Findings Chronic pain meds  Reproductive/Obstetrics                             Anesthesia Physical Anesthesia Plan  ASA: III  Anesthesia Plan: General   Post-op Pain Management:    Induction: Intravenous  PONV Risk Score and Plan: 1 and Ondansetron and Dexamethasone  Airway Management Planned: Oral ETT and LMA  Additional Equipment: None  Intra-op Plan:   Post-operative Plan: Extubation in OR  Informed Consent: I have reviewed the patients History and Physical, chart, labs and discussed the procedure including the risks, benefits and alternatives for the proposed anesthesia with the patient or authorized representative who has indicated his/her understanding and acceptance.   Dental advisory given  Plan Discussed with: CRNA and Surgeon  Anesthesia Plan Comments:         Anesthesia Quick Evaluation

## 2017-11-08 ENCOUNTER — Encounter (HOSPITAL_COMMUNITY): Payer: Self-pay | Admitting: Orthopedic Surgery

## 2017-11-08 LAB — CBC
HEMATOCRIT: 36.8 % — AB (ref 39.0–52.0)
HEMOGLOBIN: 12.2 g/dL — AB (ref 13.0–17.0)
MCH: 32 pg (ref 26.0–34.0)
MCHC: 33.2 g/dL (ref 30.0–36.0)
MCV: 96.6 fL (ref 78.0–100.0)
Platelets: 269 10*3/uL (ref 150–400)
RBC: 3.81 MIL/uL — AB (ref 4.22–5.81)
RDW: 12.1 % (ref 11.5–15.5)
WBC: 14 10*3/uL — ABNORMAL HIGH (ref 4.0–10.5)

## 2017-11-08 LAB — BASIC METABOLIC PANEL
ANION GAP: 13 (ref 5–15)
BUN: 11 mg/dL (ref 6–20)
CHLORIDE: 97 mmol/L — AB (ref 98–111)
CO2: 27 mmol/L (ref 22–32)
Calcium: 8.2 mg/dL — ABNORMAL LOW (ref 8.9–10.3)
Creatinine, Ser: 0.89 mg/dL (ref 0.61–1.24)
GFR calc non Af Amer: >60 mL/min (ref >60)
Glucose, Bld: 156 mg/dL — ABNORMAL HIGH (ref 70–99)
POTASSIUM: 4 mmol/L (ref 3.5–5.1)
Sodium: 137 mmol/L (ref 135–145)

## 2017-11-08 LAB — GLUCOSE, CAPILLARY
GLUCOSE-CAPILLARY: 156 mg/dL — AB (ref 70–99)
GLUCOSE-CAPILLARY: 189 mg/dL — AB (ref 70–99)
GLUCOSE-CAPILLARY: 145 mg/dL — AB (ref 70–99)
Glucose-Capillary: 180 mg/dL — ABNORMAL HIGH (ref 70–99)

## 2017-11-08 NOTE — Progress Notes (Signed)
Physical Therapy Treatment Patient Details Name: Adam KillianCarl K [name] MRN: [id number] DOB: 10-05-1965 Today's Date: 11/08/2017    History of Present Illness Pt is a 52 y/o male s/p elective L TKA. Pt also with continuous femoral nerve block. PMH includes HTN, DVT, tobacco abuse, spinal cord stimulator implant.     PT Comments    Pt resting in Rt sidelying in bed upon entry, reports still having very high pain levels (9/10) but agreeable to session within tolerable limites. Pt still with femoral nerve block in place and no activation of quads, poor proprioceptive awareness of knee joint, and areas of numbness. Pt is assisted with bed level exercise. Pt agreeable to attempt transfers at bedside, pt encouraged to treat LLE as TTWB d/t risk of buckling. Pt stands for 60 seconds at his request, then espouses confidence in pivot to chair. Physical assistance given to help slide Left foot on floor as needed. Pt limited still by pain and nerve block related numbness, but feels good sitting up in chair. RN filled in on all mobility in session. Pt progressing slowly toward goals. PT with concerns about ability to DC directly to home at this time. Will continue to follow acutely.   Follow Up Recommendations  Follow surgeon's recommendation for DC plan and follow-up therapies;Supervision for mobility/OOB     Equipment Recommendations  None recommended by PT    Recommendations for Other Services OT consult     Precautions / Restrictions Precautions Precautions: Knee;Fall;Other (comment) Precaution Booklet Issued: Yes (comment) Precaution Comments: Reviewed knee precautions and supine HEP. Pt also with continuous femoral nerve block.  Required Braces or Orthoses: Knee Immobilizer - Left Knee Immobilizer - Left: Other (comment)(until DC ) Restrictions LLE Weight Bearing: Weight bearing as tolerated    Mobility  Bed Mobility Overal bed mobility: Needs Assistance Bed Mobility: Supine to Sit      Supine to sit: Mod assist;+2 for physical assistance     General bed mobility comments: MinA for LLE and RUE hand held assist for pulling self to sitting  Transfers Overall transfer level: Needs assistance Equipment used: Rolling walker (2 wheeled) Transfers: Sit to/from [employer] CorporationStand;Stand Pivot Transfers Sit to Stand: From elevated surface;Min guard(Pt reports palliative quality of standing and asks to stand to tolerance while he is able. Pt stands for 3 minutes at supervision level. ) Stand pivot transfers: From elevated surface;Min assist       General transfer comment: VC and demonstration for small step rotation with RW, physical assist to LLE for foot sliding (minA) and sock removed to reduce frictional resistance.   Ambulation/Gait Ambulation/Gait assistance: (Deferred at this time, although certainly could attempt in future with assumptive LLE TTWB for safety, and lift assist to progress limb. )               Stairs             Wheelchair Mobility    Modified Rankin (Stroke Patients Only)       Balance   Sitting-balance support: No upper extremity supported;Feet supported Sitting balance-Leahy Scale: Good     Standing balance support: During functional activity;Bilateral upper extremity supported Standing balance-Leahy Scale: Fair Standing balance comment: tolerates standing at bedside x 3 minutes                            Cognition Arousal/Alertness: Awake/alert Behavior During Therapy: WFL for tasks assessed/performed Overall Cognitive Status: Within Functional Limits for tasks assessed  Exercises Total Joint Exercises Ankle Circles/Pumps: (perfrming independently PTA. ) Gluteal Sets: AROM;Left;Supine(resistance applied at the heel.) Heel Slides: Left;PROM;15 reps;Supine(limited activation. ) Hip ABduction/ADduction: 15 reps;Left;Supine;AAROM(tolerating somewhat well. ) Goniometric  ROM: Lt knee ROM: 22-64 degrees     General Comments        Pertinent Vitals/Pain Pain Assessment: 0-10 Pain Score: 9  Pain Location: Lt knee  Pain Descriptors / Indicators: Aching;Operative site guarding(catching/pulling) Pain Intervention(s): Limited activity within patient's tolerance;Monitored during session;Premedicated before session    Home Living                      Prior Function            PT Goals (current goals can now be found in the care plan section) Acute Rehab PT Goals Patient Stated Goal: "to decrease pain"  PT Goal Formulation: With patient Time For Goal Achievement: 11/21/17 Potential to Achieve Goals: Good Progress towards PT goals: Progressing toward goals    Frequency    7X/week      PT Plan Current plan remains appropriate    Co-evaluation              AM-PAC PT "6 Clicks" Daily Activity  Outcome Measure  Difficulty turning over in bed (including adjusting bedclothes, sheets and blankets)?: A Lot Difficulty moving from lying on back to sitting on the side of the bed? : Unable Difficulty sitting down on and standing up from a chair with arms (e.g., wheelchair, bedside commode, etc,.)?: Unable Help needed moving to and from a bed to chair (including a wheelchair)?: A Little Help needed walking in hospital room?: A Little Help needed climbing 3-5 steps with a railing? : Total 6 Click Score: 11    End of Session Equipment Utilized During Treatment: Left knee immobilizer Activity Tolerance: Patient tolerated treatment well;Patient limited by pain;Patient limited by fatigue Patient left: with call bell/phone within reach;in chair Nurse Communication: Mobility status;Other (comment)(use a second chair to elevate feet in gerichair. ) PT Visit Diagnosis: Unsteadiness on feet (R26.81);Other abnormalities of gait and mobility (R26.89);Other symptoms and signs involving the nervous system (R29.898);Pain Pain - Right/Left: Left Pain  - part of body: Knee     Time: 4098-1191 PT Time Calculation (min) (ACUTE ONLY): 33 min  Charges:  $Therapeutic Exercise: 8-22 mins $Therapeutic Activity: 8-22 mins                     3:36 PM, 11/08/17 [name], PT, DPT Physical Therapist - [place] [phone] (Pager)  [phone] (Office)     Buccola,Allan C 11/08/2017, 3:32 PM

## 2017-11-08 NOTE — Progress Notes (Signed)
OT Cancellation Note  Patient Details Name: Adam KillianCarl K [name] MRN: [id number] DOB: 08/22/1965   Cancelled Treatment:    Reason Eval/Treat Not Completed: Pain limiting ability to participate  RN notified.  Will check back on pt later this day or next day Lise AuerLori Lula Michaux, [place] [phone] Einar CrowEDDING, [name] 11/08/2017, 2:23 PM

## 2017-11-08 NOTE — Progress Notes (Signed)
Subjective: Pt stable - having posterior knee pain   Objective: Vital signs in last 24 hours: Temp:  [97.7 F (36.5 C)-99 F (37.2 C)] 98 F (36.7 C) (07/31 0500) Pulse Rate:  [74-106] 76 (07/31 0500) Resp:  [10-20] 16 (07/31 0500) BP: (102-126)/(60-81) 125/67 (07/31 0500) SpO2:  [93 %-100 %] 100 % (07/31 0500) Weight:  [350 lb (158.8 kg)] 350 lb (158.8 kg) (07/30 1824)  Intake/Output from previous day: 07/30 0701 - 07/31 0700 In: 2816 [P.O.:1320; I.V.:1233.4] Out: 850 [Urine:800; Blood:50] Intake/Output this shift: No intake/output data recorded.  Exam:  Dorsiflexion/Plantar flexion intact  Labs: Recent Labs    11/08/17 0420  HGB 12.2*   Recent Labs    11/08/17 0420  WBC 14.0*  RBC 3.81*  HCT 36.8*  PLT 269   Recent Labs    11/08/17 0420  NA 137  K 4.0  CL 97*  CO2 27  BUN 11  CREATININE 0.89  GLUCOSE 156*  CALCIUM 8.2*   No results for input(s): LABPT, INR in the last 72 hours.  Assessment/Plan: Plan pt today - on xarelto - on cpm - possible dc catheter tomorrow pm with dc Friday - need ultrasound tomorrow to r/o dvt   Mirant Scott Rickia [place] 11/08/2017, 8:21 AM

## 2017-11-09 ENCOUNTER — Encounter (HOSPITAL_COMMUNITY): Payer: Self-pay | Admitting: Anesthesiology

## 2017-11-09 ENCOUNTER — Encounter (HOSPITAL_COMMUNITY): Payer: Self-pay | Admitting: Orthopedic Surgery

## 2017-11-09 LAB — GLUCOSE, CAPILLARY
Glucose-Capillary: 133 mg/dL — ABNORMAL HIGH (ref 70–99)
Glucose-Capillary: 134 mg/dL — ABNORMAL HIGH (ref 70–99)
Glucose-Capillary: 151 mg/dL — ABNORMAL HIGH (ref 70–99)
Glucose-Capillary: 138 mg/dL — ABNORMAL HIGH (ref 70–99)

## 2017-11-09 MED ORDER — ASPIRIN EC 81 MG PO TBEC: 81.0000 mg | DELAYED_RELEASE_TABLET | Freq: Two times a day (BID) | ORAL | 2 refills | Status: AC

## 2017-11-09 NOTE — Care Management Note (Signed)
Case Management Note  Patient Details  Name: Charise KillianCarl K Musgrave MRN: [id number] Date of Birth: [birth year]  Subjective/Objective:   52 yr old gentleman s/p left total knee arthroplasty.                 Action/Plan: Patient was preoperatively setup with [facility], no changes. Will have support at discharge.    Expected Discharge Date:   11/10/17               Expected Discharge Plan:  Home w Home Health Services  In-House Referral:  NA  Discharge planning Services  CM Consult  Post Acute Care Choice:  Durable Medical Equipment, Home Health Choice offered to:  Patient  DME Arranged:  3-N-1, Walker rolling DME Agency:  [employer]  HH Arranged:  PT, OT HH Agency:  [facility] ([facility]  Status of Service:  Completed, signed off  If discussed at [employer] Meetings, dates discussed:    Additional Comments:  Durenda GuthrieBrady, Livan Hires Naomi, RN 11/09/2017, 1:05 PM

## 2017-11-09 NOTE — Progress Notes (Signed)
Physical Therapy Treatment Patient Details Name: Adam KillianCarl K [name] MRN: [id number] DOB: 1965-09-28 Today's Date: 11/09/2017    History of Present Illness Pt is a 52 y/o male s/p elective L TKA. Pt also with continuous femoral nerve block. PMH includes HTN, DVT, tobacco abuse, spinal cord stimulator implant.     PT Comments    Pt asleep in Rt side lying  Upon entry, reports [facility] then [facility] to bed, and is wornout. Session focus on RLE strengthening, and other HEP as well. Pt still with significant pain, breathholding during even LLE abduction heel slides with KI donned.  Pain otherwise remains reports 5-6/10. Will continue to follow acutely.    Follow Up Recommendations  Follow surgeon's recommendation for DC plan and follow-up therapies;Supervision for mobility/OOB;SNF     Equipment Recommendations  None recommended by PT    Recommendations for Other Services OT consult     Precautions / Restrictions Precautions Precautions: Knee;Fall Precaution Comments: Reviewed knee precautions and supine HEP. Pt also with continuous femoral nerve block.  Required Braces or Orthoses: Knee Immobilizer - Left Knee Immobilizer - Left: Other (comment) Restrictions LLE Weight Bearing: Weight bearing as tolerated Other Position/Activity Restrictions: Bari RW     Mobility  Bed Mobility Overal bed mobility: (deferred this session [facility] and "[facility]orn out" ) Bed Mobility: Sit to Supine       Sit to supine: Min assist   General bed mobility comments: LLE lift assist.   Transfers Overall transfer level: Needs assistance     Sit to Stand: Min assist;+2 safety/equipment;+2 physical assistance         General transfer comment: Multiple attempts required, pt in low chair, limited by exacerbation of right foot pain when in loading.    Ambulation/Gait Ambulation/Gait assistance: Min guard;Supervision Gait Distance (Feet): 8  Feet Assistive device: (bari RW ) Gait Pattern/deviations: Antalgic     General Gait Details: RLE hop-to gait with self selected TTWB on LLE. 544ft fwd, rests, then 784ft retro AMB. Tolerates standing for a total of 3 minutes, then asks to return to bed.    Stairs             Wheelchair Mobility    Modified Rankin (Stroke Patients Only)       Balance           Standing balance support: During functional activity Standing balance-Leahy Scale: Poor                              Cognition Arousal/Alertness: (drowsy) Behavior During Therapy: WFL for tasks assessed/performed Overall Cognitive Status: Within Functional Limits for tasks assessed                                        Exercises Total Joint Exercises Ankle Circles/Pumps: (performing independently ad lib) Towel Squeeze: AROM;Right;15 reps;Supine Heel Slides: AROM;15 reps;Supine;AAROM;Both;Limitations Heel Slides Limitations: A/ROM Rt; AA/ROM left Hip ABduction/ADduction: AROM;15 reps;Supine;Right Straight Leg Raises: AROM;15 reps;Right;Supine Long Arc Quad: PROM;Left;Seated;10 reps Knee Flexion: PROM;Left;Seated;10 reps Goniometric ROM: Left knee ROM: 21-66 degrees (22-64 1DA)  Bridges: AROM;10 reps;Right;Supine;Limitations Bridges Limitations: VC for 3 second hold (mostly isometric)  Other Exercises Other Exercises: Right LE leg press, manually resisted at the heel: 1x10     General Comments        Pertinent  Vitals/Pain Pain Assessment: 0-10 Pain Score: 6  Pain Location: Lt knee, Pain Descriptors / Indicators: Aching;Operative site guarding Pain Intervention(s): Limited activity within patient's tolerance;Monitored during session    Home Living                      Prior Function            PT Goals (current goals can now be found in the care plan section) Acute Rehab PT Goals Patient Stated Goal: "to decrease pain"  PT Goal Formulation: With  patient Time For Goal Achievement: 11/21/17 Potential to Achieve Goals: Good Progress towards PT goals: Not progressing toward goals - comment;PT to reassess next treatment    Frequency    7X/week      PT Plan Current plan remains appropriate    Co-evaluation              AM-PAC PT "6 Clicks" Daily Activity  Outcome Measure  Difficulty turning over in bed (including adjusting bedclothes, sheets and blankets)?: A Lot Difficulty moving from lying on back to sitting on the side of the bed? : Unable Difficulty sitting down on and standing up from a chair with arms (e.g., wheelchair, bedside commode, etc,.)?: Unable Help needed moving to and from a bed to chair (including a wheelchair)?: A Lot Help needed walking in hospital room?: A Lot Help needed climbing 3-5 steps with a railing? : Total 6 Click Score: 9    End of Session Equipment Utilized During Treatment: Left knee immobilizer Activity Tolerance: Patient tolerated treatment well;Patient limited by pain;Patient limited by fatigue Patient left: with call bell/phone within reach;in bed;Other (comment)   PT Visit Diagnosis: Unsteadiness on feet (R26.81);Other abnormalities of gait and mobility (R26.89);Other symptoms and signs involving the nervous system (R29.898);Pain Pain - Right/Left: Left Pain - part of body: Knee     Time: 1610-9604 PT Time Calculation (min) (ACUTE ONLY): 18 min  Charges:  $Gait Training: 8-22 mins $Therapeutic Exercise: 8-22 mins $Therapeutic Activity: 8-22 mins                     2:22 PM, 11/09/17 [name], PT, DPT Physical Therapist - [place] [phone] (Pager)  [phone] (Office)      [name] 11/09/2017, 2:20 PM

## 2017-11-09 NOTE — Anesthesia Postprocedure Evaluation (Signed)
Anesthesia Post Note  Patient: Frankey ShownCarl K Letizia  Procedure(s) Performed: LEFT TOTAL KNEE ARTHROPLASTY (Left Knee)     Patient location during evaluation: PACU Anesthesia Type: General and Regional Level of consciousness: awake and alert Pain management: pain level controlled Vital Signs Assessment: post-procedure vital signs reviewed and stable Respiratory status: spontaneous breathing, nonlabored ventilation, respiratory function stable and patient connected to nasal cannula oxygen Cardiovascular status: blood pressure returned to baseline and stable Postop Assessment: no apparent nausea or vomiting Anesthetic complications: no    Last Vitals:  Vitals:   11/09/17 1311 11/09/17 2047  BP: 134/68 103/63  Pulse: (!) 113 91  Resp: 18   Temp: 36.9 C 37.2 C  SpO2: 94% 95%    Last Pain:  Vitals:   11/09/17 2208  TempSrc:   PainSc: 7                  Anjelika Ausburn

## 2017-11-09 NOTE — Evaluation (Signed)
Occupational Therapy Evaluation Patient Details Name: Charise KillianCarl K Seth MRN: [id number] DOB: [birth year] Today's Date: 11/09/2017    History of Present Illness Pt is a 52 y/o male s/p elective L TKA. Pt also with continuous femoral nerve block. PMH includes HTN, DVT, tobacco abuse, spinal cord stimulator implant.    Clinical Impression   Pt admitted for elective L TKA. Pt is now presenting with post op pain and functional mobility/ADL limitations. Pt able to perform lateral scoot from bed >BSC > recliner with min guard, vc for sequencing UE placement and safety. Pt performed UB/LB bathing sitting on BSC using lateral leans. Discussed OT POC, d/c planning, and fall risk reduction at home.     Follow Up Recommendations  Home health OT;Supervision - Intermittent    Equipment Recommendations  None recommended by OT    Recommendations for Other Services PT consult     Precautions / Restrictions Precautions Precautions: Knee;Fall Required Braces or Orthoses: Knee Immobilizer - Left Restrictions Weight Bearing Restrictions: Yes LLE Weight Bearing: Weight bearing as tolerated      Mobility Bed Mobility Overal bed mobility: Needs Assistance Bed Mobility: Rolling;Supine to Sit Rolling: Supervision   Supine to sit: Min guard;HOB elevated     General bed mobility comments: (Min A for LLE management)  Transfers Overall transfer level: Needs assistance Equipment used: None Transfers: Lateral/Scoot Transfers Sit to Stand: Min guard        Lateral/Scoot Transfers: Min guard      Balance Overall balance assessment: Needs assistance Sitting-balance support: No upper extremity supported;Feet supported Sitting balance-Leahy Scale: Good Sitting balance - Comments: (Pt able to reach to feet  from sitting with no LOB) Postural control: Right lateral lean;Left lateral lean                                 ADL either performed or assessed with clinical judgement   ADL  Overall ADL's : Needs assistance/impaired Eating/Feeding: Modified independent   Grooming: Wash/dry hands;Wash/dry face;Applying deodorant;Brushing hair;Modified independent(in seated)   Upper Body Bathing: Set up;Sitting   Lower Body Bathing: Minimal assistance;Sitting/lateral leans   Upper Body Dressing : Set up;Sitting   Lower Body Dressing: Moderate assistance;Sitting/lateral leans   Toilet Transfer: Moderate assistance(lateral scoot toward R)   Toileting- Clothing Manipulation and Hygiene: Moderate assistance;Sitting/lateral lean       Functional mobility during ADLs: Min guard(lateral scoot) General ADL Comments: All ADLs performed sitting on BSC     Vision Baseline Vision/History: No visual deficits Patient Visual Report: No change from baseline Vision Assessment?: No apparent visual deficits     Perception     Praxis      Pertinent Vitals/Pain Pain Assessment: 0-10 Pain Score: 7  Pain Location: Lt knee  Pain Descriptors / Indicators: Aching;Operative site guarding Pain Intervention(s): (increased mobilization, weight bearing )     Hand Dominance Right   Extremity/Trunk Assessment Upper Extremity Assessment Upper Extremity Assessment: Overall WFL for tasks assessed   Lower Extremity Assessment Lower Extremity Assessment: Defer to PT evaluation   Cervical / Trunk Assessment Cervical / Trunk Assessment: Normal   Communication Communication Communication: No difficulties   Cognition Arousal/Alertness: Awake/alert Behavior During Therapy: WFL for tasks assessed/performed Overall Cognitive Status: Within Functional Limits for tasks assessed  General Comments       Exercises     Shoulder Instructions      Home Living Family/patient expects to be discharged to:: Private residence Living Arrangements: Other relatives Available Help at Discharge: Family;Available PRN/intermittently Type of Home:  House Home Access: Level entry     Home Layout: Two level;Able to live on main level with bedroom/bathroom     Bathroom Shower/Tub: Walk-in shower   Bathroom Toilet: Standard(raised toilet seat)     Home Equipment: Environmental consultant - 2 wheels;Bedside commode;Shower seat;Cane - single point          Prior Functioning/Environment Level of Independence: Independent with assistive device(s)        Comments: Used cane for ambulation         OT Problem List: Decreased strength;Decreased range of motion;Decreased activity tolerance;Impaired balance (sitting and/or standing);Impaired sensation;Pain      OT Treatment/Interventions: Self-care/ADL training;Therapeutic exercise;Energy conservation;DME and/or AE instruction;Therapeutic activities;Patient/family education;Balance training    OT Goals(Current goals can be found in the care plan section) Acute Rehab OT Goals Patient Stated Goal: "to decrease pain"  OT Goal Formulation: With patient Time For Goal Achievement: 11/23/17 Potential to Achieve Goals: Good  OT Frequency: Min 3X/week   Barriers to D/C:            Co-evaluation              AM-PAC PT "6 Clicks" Daily Activity     Outcome Measure Help from another person eating meals?: None Help from another person taking care of personal grooming?: None Help from another person toileting, which includes using toliet, bedpan, or urinal?: A Lot Help from another person bathing (including washing, rinsing, drying)?: A Little Help from another person to put on and taking off regular upper body clothing?: None Help from another person to put on and taking off regular lower body clothing?: A Lot 6 Click Score: 19   End of Session CPM Left Knee CPM Left Knee: Off Left Knee Flexion (Degrees): 40 Left Knee Extension (Degrees): 10 Nurse Communication: Mobility status  Activity Tolerance: Patient tolerated treatment well Patient left: in chair;with call bell/phone within  reach  OT Visit Diagnosis: Muscle weakness (generalized) (M62.81);Unsteadiness on feet (R26.81)                Time: 1610-9604 OT Time Calculation (min): 42 min Charges:  OT General Charges $OT Visit: 1 Visit OT Evaluation $OT Eval Moderate Complexity: 1 Mod OT Treatments $Self Care/Home Management : 23-37 mins   Crissie Reese, OTR/L 11/09/2017, 8:59 AM

## 2017-11-09 NOTE — Progress Notes (Signed)
Patient stable Pain reasonably well controlled at this time Plan is to obtain ultrasound tomorrow to rule out DVT in that left lower extremity.  Pain catheter will be withdrawn tomorrow.  He may have increase in pain at that time.  We really have no where to go in terms of increasing his pain medicine based on the amount he was on preoperatively.  Therefore he will have to manage with what he has been taking.  Anticipate discharge to home Saturday.  Prescriptions on chart.

## 2017-11-09 NOTE — Anesthesia Post-op Follow-up Note (Signed)
  Anesthesia Pain Follow-up Note  Patient: Adam [name]  Post op Day #: 2  Date of Follow-up: 11/09/2017 Time: 10:47 PM  Last Vitals:  Vitals:   11/09/17 1311 11/09/17 2047  BP: 134/68 103/63  Pulse: (!) 113 91  Resp: 18   Temp: 36.9 C 37.2 C  SpO2: 94% 95%    Level of Consciousness: alert  Pain: mild posterior left leg  Side Effects:None  Catheter Site Exam:clean, dry  Anti-Coag Meds (From admission, onward)   Start     Dose/Rate Route Frequency Ordered Stop   11/07/17 2200  rivaroxaban (XARELTO) tablet 10 mg     10 mg Oral Daily with supper 11/07/17 1615         Plan: Continue current therapy of postop peripheral nerve catheter at surgeon's request. Will stop Ropivacaine infusion tomorrow 8/2 at 0600 with plan to remove cathter 8/2.   Maricus Tanzi

## 2017-11-09 NOTE — Anesthesia Post-op Follow-up Note (Signed)
  Anesthesia Pain Follow-up Note  Patient: Adam [name]  Post op Day #: 1  Date of Follow-up: 11/08/2017 Time: 1701   Last Vitals:  Vitals:   11/09/17 1311 11/09/17 2047  BP: 134/68 103/63  Pulse: (!) 113 91  Resp: 18   Temp: 36.9 C 37.2 C  SpO2: 94% 95%    Level of Consciousness: alert  Pain: mild posterior left leg  Side Effects:None  Catheter Site Exam:clean, dry  Anti-Coag Meds (From admission, onward)   Start     Dose/Rate Route Frequency Ordered Stop   11/07/17 2200  rivaroxaban (XARELTO) tablet 10 mg     10 mg Oral Daily with supper 11/07/17 1615         Plan: Continue current therapy of postop peripheral nerve catheter at surgeon's request  Anyelo Mccue

## 2017-11-09 NOTE — Progress Notes (Signed)
Physical Therapy Treatment Patient Details Name: Adam [name] MRN: [id number] DOB: 1965-09-03 Today's Date: 11/09/2017    History of Present Illness Pt is a 52 y/o male s/p elective L TKA. Pt also with continuous femoral nerve block. PMH includes HTN, DVT, tobacco abuse, spinal cord stimulator implant.     PT Comments    Pt up in chair upon entry, legs elevated. Pt agreeable to participate in PT session. Focus on transfers and gait training. Pt continues to demonstrate +2 need for transfers, most limited by BUE insufficiency and acutely exacerbated chronic Right foot/ankle pain with loading. Pt's tall stature in this context creates a need for elevated surface that simply cannot be met aside from the hospital bed. Pt able to AMB 8 feet total with a hop-to gait, but progression of the operative limb is labored by nerve block and BUE weakness is moderately limiting, pt diaphoretic and SOB at end of session. Heavy physical assist required for transfers from standard surface height. Concerns over safety and independence with DC to home, PT thinking STR is more appropriate. Pt asserts that he will thrive upon return to home, although suspect some limited insight into how current deficits will play out at home. Will continue to follow acutely.    Follow Up Recommendations  Follow surgeon's recommendation for DC plan and follow-up therapies;Supervision for mobility/OOB;SNF     Equipment Recommendations  None recommended by PT    Recommendations for Other Services OT consult     Precautions / Restrictions Precautions Precautions: Knee;Fall Precaution Comments: Reviewed knee precautions and supine HEP. Pt also with continuous femoral nerve block.  Required Braces or Orthoses: Knee Immobilizer - Left Restrictions Weight Bearing Restrictions: Yes LLE Weight Bearing: Weight bearing as tolerated Other Position/Activity Restrictions: Bari RW     Mobility  Bed Mobility Overal bed mobility:  Needs Assistance Bed Mobility: Sit to Supine Rolling: Supervision   Supine to sit: Min guard;HOB elevated Sit to supine: Min assist   General bed mobility comments: LLE lift assist.   Transfers Overall transfer level: Needs assistance Equipment used: None Transfers: Lateral/Scoot Transfers Sit to Stand: Min assist;+2 safety/equipment;+2 physical assistance        Lateral/Scoot Transfers: Min guard General transfer comment: Multiple attempts required, pt in low chair, limited by exacerbation of right foot pain when in loading.    Ambulation/Gait Ambulation/Gait assistance: Min guard;Supervision Gait Distance (Feet): 8 Feet Assistive device: (bari RW ) Gait Pattern/deviations: Antalgic     General Gait Details: RLE hop-to gait with self selected TTWB on LLE. 81f fwd, rests, then 493fretro AMB. Tolerates standing for a total of 3 minutes, then asks to return to bed.    Stairs             Wheelchair Mobility    Modified Rankin (Stroke Patients Only)       Balance Overall balance assessment: Needs assistance Sitting-balance support: No upper extremity supported;Feet supported Sitting balance-Leahy Scale: Good Sitting balance - Comments: (Pt able to reach to feet  from sitting with no LOB) Postural control: Right lateral lean;Left lateral lean Standing balance support: During functional activity Standing balance-Leahy Scale: Poor                              Cognition Arousal/Alertness: Awake/alert Behavior During Therapy: WFL for tasks assessed/performed Overall Cognitive Status: Within Functional Limits for tasks assessed  Exercises Total Joint Exercises Long Arc Quad: PROM;Left;Seated;10 reps Knee Flexion: PROM;Left;Seated;10 reps Goniometric ROM: Left knee ROM: 21-66 degrees (22-64 1DA)     General Comments        Pertinent Vitals/Pain Pain Assessment: 0-10 Pain Score: 5  Pain  Location: Lt knee, also [name]/o acute on chronic Rt foot pain with loading during transfers.  Pain Descriptors / Indicators: Aching;Operative site guarding Pain Intervention(s): (increased mobilization, weight bearing )    Home Living Family/patient expects to be discharged to:: Private residence Living Arrangements: Other relatives Available Help at Discharge: Family;Available PRN/intermittently Type of Home: House Home Access: Level entry   Home Layout: Two level;Able to live on main level with bedroom/bathroom Home Equipment: Gilford Rile - 2 wheels;Bedside commode;Shower seat;Cane - single point      Prior Function Level of Independence: Independent with assistive device(s)      Comments: Used cane for ambulation    PT Goals (current goals can now be found in the care plan section) Acute Rehab PT Goals Patient Stated Goal: "to decrease pain"  PT Goal Formulation: With patient Time For Goal Achievement: 11/21/17 Potential to Achieve Goals: Good Progress towards PT goals: Progressing toward goals    Frequency    7X/week      PT Plan Current plan remains appropriate    Co-evaluation              AM-PAC PT "6 Clicks" Daily Activity  Outcome Measure  Difficulty turning over in bed (including adjusting bedclothes, sheets and blankets)?: A Lot Difficulty moving from lying on back to sitting on the side of the bed? : Unable Difficulty sitting down on and standing up from a chair with arms (e.g., wheelchair, bedside commode, etc,.)?: Unable Help needed moving to and from a bed to chair (including a wheelchair)?: A Lot Help needed walking in hospital room?: A Lot Help needed climbing 3-5 steps with a railing? : Total 6 Click Score: 9    End of Session Equipment Utilized During Treatment: Left knee immobilizer Activity Tolerance: Patient tolerated treatment well;Patient limited by pain;Patient limited by fatigue Patient left: with call bell/phone within reach;in bed;Other  (comment)(Left KI donned, heel elevated in bone-foam)   PT Visit Diagnosis: Unsteadiness on feet (R26.81);Other abnormalities of gait and mobility (R26.89);Other symptoms and signs involving the nervous system (R29.898);Pain Pain - Right/Left: Left Pain - part of body: Knee     Time: 9381-0175 PT Time Calculation (min) (ACUTE ONLY): 32 min  Charges:  $Gait Training: 8-22 mins $Therapeutic Activity: 8-22 mins                     10:39 AM, 11/09/17 Adam [name], PT, DPT Physical Therapist - [facility] [phone] (Pager)  [phone] (Office)      Adam [name] 11/09/2017, 10:34 AM

## 2017-11-10 ENCOUNTER — Inpatient Hospital Stay (HOSPITAL_COMMUNITY): Payer: Medicare Other

## 2017-11-10 DIAGNOSIS — Z9889 Other specified postprocedural states: Secondary | ICD-10-CM

## 2017-11-10 LAB — GLUCOSE, CAPILLARY
GLUCOSE-CAPILLARY: 136 mg/dL — AB (ref 70–99)
GLUCOSE-CAPILLARY: 145 mg/dL — AB (ref 70–99)
Glucose-Capillary: 112 mg/dL — ABNORMAL HIGH (ref 70–99)
Glucose-Capillary: 138 mg/dL — ABNORMAL HIGH (ref 70–99)

## 2017-11-10 NOTE — Progress Notes (Signed)
peripheral nerve catheter turned off per MD request.

## 2017-11-10 NOTE — Progress Notes (Signed)
*  Preliminary Results* Left lower extremity venous duplex completed. Left lower extremity is negative for deep vein thrombosis. There is no evidence of left Baker's cyst.  11/10/2017 11:59 AM  Aundra MilletMegan Clare Gandyiddle

## 2017-11-10 NOTE — Progress Notes (Deleted)
Ace wrap removed from left leg per Dr.Dean.

## 2017-11-10 NOTE — Progress Notes (Signed)
Pt instructed in use of AE for LB bathing and dressing as well as compensatory techniques. Pt with stool requiring use of BSC instead of ambulating to the bathroom due to urgency. Performed transfer and pericare with min guard assist. Educated pt in shower transfer using RW and transporting items safely with RW. Pt verbalizing understanding of all information. Updated d/c plan and frequency.  11/10/17 1300  OT Visit Information  Last OT Received On 11/10/17  Assistance Needed +1  History of Present Illness Pt is a 52 y/o male s/p elective L TKA. Pt also with continuous femoral nerve block. PMH includes HTN, DVT, tobacco abuse, spinal cord stimulator implant.   Precautions  Precautions Knee;Fall  Required Braces or Orthoses Knee Immobilizer - Left  Knee Immobilizer - Left  (left KI on for sleep)  Pain Assessment  Pain Assessment Faces  Faces Pain Scale 6  Pain Location Lt knee,  Pain Descriptors / Indicators Sore  Pain Intervention(s) Monitored during session  Cognition  Arousal/Alertness Awake/alert  Behavior During Therapy WFL for tasks assessed/performed  Overall Cognitive Status Within Functional Limits for tasks assessed  ADL  Overall ADL's  Needs assistance/impaired  Grooming Wash/dry hands;Set up;Sitting  Lower Body Bathing Details (indicate cue type and reason) pt has a long handled bath sponge at home  Lower Body Dressing Minimal assistance;Sit to/from stand  Lower Body Dressing Details (indicate cue type and reason) educated pt in use of reacher for donning pants and in safe footwear, pt does not plan to wear socks  Toilet Transfer Min guard;Ambulation;BSC;RW  Toileting- Dealer guard;Sit to/from Investment banker, operational Details (indicate cue type and reason) instructed in technique for stepping over shower ledge with RW, pt has shower seat  Functional mobility during ADLs Min guard;Rolling walker  General ADL Comments Educated pt in how to  transport items safely with RW.  Bed Mobility  Overal bed mobility Needs Assistance  Bed Mobility Sit to Supine  Sit to supine Min assist  General bed mobility comments assisted L LE into bed  Balance  Overall balance assessment Needs assistance  Sitting balance-Leahy Scale Good  Standing balance-Leahy Scale Poor  Restrictions  Weight Bearing Restrictions Yes  LLE Weight Bearing WBAT  Other Position/Activity Restrictions Bari RW   Transfers  Overall transfer level Needs assistance  Equipment used Rolling walker (2 wheeled)  Transfers Sit to/from Stand  Sit to [employer] transfer comment cues for hand placement, increased time and effort  OT - End of Session  Equipment Utilized During Treatment Gait belt;Rolling walker;Left knee immobilizer  Activity Tolerance Patient tolerated treatment well  Patient left in bed;with call bell/phone within reach  OT Assessment/Plan  OT Plan Discharge plan remains appropriate;Frequency needs to be updated  OT Visit Diagnosis Muscle weakness (generalized) (M62.81);Unsteadiness on feet (R26.81)  OT Frequency (ACUTE ONLY) Min 2X/week  Follow Up Recommendations Follow surgeon's recommendation for DC plan and follow-up therapies  OT Equipment None recommended by OT  AM-PAC OT "6 Clicks" Daily Activity Outcome Measure  Help from another person eating meals? 4  Help from another person taking care of personal grooming? 3  Help from another person toileting, which includes using toliet, bedpan, or urinal? 3  Help from another person bathing (including washing, rinsing, drying)? 3  Help from another person to put on and taking off regular upper body clothing? 4  Help from another person to put on and taking off regular lower body clothing? 3  6 Click  Score 20  ADL G Code Conversion CJ  OT Goal Progression  Progress towards OT goals Progressing toward goals  Acute Rehab OT Goals  Patient Stated Goal "to decrease pain"   OT Goal  Formulation With patient  Time For Goal Achievement 11/23/17  Potential to Achieve Goals Good  OT Time Calculation  OT Start Time (ACUTE ONLY) 1339  OT Stop Time (ACUTE ONLY) 1354  OT Time Calculation (min) 15 min  OT General Charges  $OT Visit 1 Visit  OT Treatments  $Self Care/Home Management  8-22 mins  11/10/2017 Martie RoundJulie Egon Dittus, OTR/L Pager: [phone]

## 2017-11-10 NOTE — Addendum Note (Signed)
Addendum  created 11/10/17 1303 by Heather RobertsSinger, Daniah Zaldivar, MD   Sign clinical note

## 2017-11-10 NOTE — Progress Notes (Signed)
Physical Therapy Treatment Patient Details Name: Adam [name] MRN: [id number] DOB: [birth year] Today's Date: 11/10/2017    History of Present Illness Pt is a 52 y/o male s/p elective L TKA. Pt also with continuous femoral nerve block. PMH includes HTN, DVT, tobacco abuse, spinal cord stimulator implant.     PT Comments    Pt hesitant to mobilize this pm but with encouragement able to progress gait distance.  Slight instability noted in L stance phase but improves as gait progressed.  Plan for return home in am.  Will f/u for further progression of gait training, exercises and functional mobility.   Follow Up Recommendations  Follow surgeon's recommendation for DC plan and follow-up therapies;Supervision for mobility/OOB;SNF     Equipment Recommendations  None recommended by PT    Recommendations for Other Services       Precautions / Restrictions Precautions Precautions: Knee;Fall Precaution Booklet Issued: Yes (comment) Precaution Comments: Reviewed knee precautions and supine HEP. Pt also with continuous femoral nerve block.  Required Braces or Orthoses: Knee Immobilizer - Left(did not use for gt training.  ) Knee Immobilizer - Left: (left KI on for sleep) Restrictions Weight Bearing Restrictions: Yes LLE Weight Bearing: Weight bearing as tolerated Other Position/Activity Restrictions: Bari RW     Mobility  Bed Mobility Overal bed mobility: Needs Assistance Bed Mobility: Supine to Sit     Supine to sit: Min assist Sit to supine: Min assist   General bed mobility comments: assisted LLE out of bed.    Transfers Overall transfer level: Needs assistance Equipment used: Rolling walker (2 wheeled) Transfers: Sit to/from Stand Sit to Stand: Min [name];Min assist         General transfer comment: cues for hand placement, increased time and effort.  min assist from low seated recliner  Ambulation/Gait Ambulation/Gait assistance: Min [name] Gait Distance (Feet): 90  Feet Assistive device: Rolling walker (2 wheeled)(bari RW) Gait Pattern/deviations: Antalgic;Step-to pattern;Shuffle;Decreased stance time - left;Decreased step length - right     General Gait Details: Cues for weight shifting to L and to performed step to pattern with symmetry.  Pt with improved sequencing without use of KI.  Cues for L heel strike and knee extension in stance phase.     Stairs             Wheelchair Mobility    Modified Rankin (Stroke Patients Only)       Balance Overall balance assessment: Needs assistance Sitting-balance support: No upper extremity supported;Feet supported Sitting balance-Leahy Scale: Good       Standing balance-Leahy Scale: Poor                              Cognition Arousal/Alertness: Awake/alert Behavior During Therapy: WFL for tasks assessed/performed Overall Cognitive Status: Within Functional Limits for tasks assessed                                        Exercises      General Comments        Pertinent Vitals/Pain Pain Assessment: 0-10 Pain Score: 6  Faces Pain Scale: Hurts even more Pain Location: Lt knee, Pain Descriptors / Indicators: Sore Pain Intervention(s): Monitored during session;Repositioned;Ice applied    Home Living  Prior Function            PT Goals (current goals can now be found in the care plan section) Acute Rehab PT Goals Patient Stated Goal: "to decrease pain"  Potential to Achieve Goals: Good Progress towards PT goals: Progressing toward goals    Frequency    7X/week      PT Plan Current plan remains appropriate    Co-evaluation              AM-PAC PT "6 Clicks" Daily Activity  Outcome Measure  Difficulty turning over in bed (including adjusting bedclothes, sheets and blankets)?: A Lot Difficulty moving from lying on back to sitting on the side of the bed? : Unable Difficulty sitting down on and standing  up from a chair with arms (e.g., wheelchair, bedside commode, etc,.)?: Unable Help needed moving to and from a bed to chair (including a wheelchair)?: A Lot Help needed walking in hospital room?: A Lot Help needed climbing 3-5 steps with a railing? : Total 6 Click Score: 9    End of Session Equipment Utilized During Treatment: Gait belt Activity Tolerance: Patient tolerated treatment well;Patient limited by pain;Patient limited by fatigue Patient left: in chair;with call bell/phone within reach   PT Visit Diagnosis: Unsteadiness on feet (R26.81);Other abnormalities of gait and mobility (R26.89);Other symptoms and signs involving the nervous system (R29.898);Pain Pain - Right/Left: Left Pain - part of body: Knee     Time: 1610-9604 PT Time Calculation (min) (ACUTE ONLY): 23 min  Charges:  $Gait Training: 8-22 mins $Therapeutic Activity: 8-22 mins                     Joycelyn Rua, PTA pager [phone]    Florestine Avers 11/10/2017, 4:17 PM

## 2017-11-10 NOTE — Anesthesia Post-op Follow-up Note (Signed)
  Anesthesia Pain Follow-up Note  Patient: Adam [name]  Day #: 3  Date of Follow-up: 11/10/2017 Time: 1:01 PM  Last Vitals:  Vitals:   11/09/17 2047 11/10/17 0547  BP: 103/63 115/72  Pulse: 91 89  Resp:    Temp: 37.2 C   SpO2: 95% 95%    Level of Consciousness: alert  Pain: mild   Side Effects:None  Catheter Site Exam:clean, dry  Anti-Coag Meds (From admission, onward)   Start     Dose/Rate Route Frequency Ordered Stop   11/07/17 2200  rivaroxaban (XARELTO) tablet 10 mg     10 mg Oral Daily with supper 11/07/17 1615         Plan: Catheter removed/tip intact at surgeon's request  Geran Haithcock DANIEL

## 2017-11-10 NOTE — Progress Notes (Signed)
Patient ID: Adam KillianCarl K Bastos, male   DOB: 28-Jul-1965, 52 y.o.   MRN: [id number] Patient is a 52 year old gentleman status post left total knee arthroplasty.  Patient has no complaints this morning.  Ultrasound scheduled today to rule out DVT.  Anticipate discharge to home tomorrow Saturday

## 2017-11-10 NOTE — Clinical Social Work Note (Signed)
Per pt he will d/c home with home health. Pt sates he was given a home health brochure. Appears that RNCM had provided him with brochure. Clinical Social Worker will sign off for now as social work intervention is no longer needed. Please consult us again if new need arises.   Clarisse GougeBridget A [place] Oborn 11/10/2017

## 2017-11-10 NOTE — Progress Notes (Signed)
Physical Therapy Treatment Patient Details Name: Adam [name] MRN: [id number] DOB: 02-13-1966 Today's Date: 11/10/2017    History of Present Illness Pt is a 52 y/o male s/p elective L TKA. Pt also with continuous femoral nerve block. PMH includes HTN, DVT, tobacco abuse, spinal cord stimulator implant.     PT Comments    Pt performed increased gait and activity this session with improved weight bearing.  Did not use KI as it hindered swing phase of gt training.  Adjusted RW height to improve fit and use of UEs to off set weight on surgical limb.  Plan next session for continued gait training and progression to therapeutic exercises.  Pt returned to bed as he is awaiting a doppler study.    Follow Up Recommendations  Follow surgeon's recommendation for DC plan and follow-up therapies;Supervision for mobility/OOB;SNF     Equipment Recommendations       Recommendations for Other Services OT consult     Precautions / Restrictions Precautions Precautions: Knee;Fall Precaution Booklet Issued: Yes (comment) Precaution Comments: Reviewed knee precautions and supine HEP. Pt also with continuous femoral nerve block.  Required Braces or Orthoses: Knee Immobilizer - Left Knee Immobilizer - Left: Other (comment) Restrictions Weight Bearing Restrictions: Yes LLE Weight Bearing: Weight bearing as tolerated Other Position/Activity Restrictions: Bari RW     Mobility  Bed Mobility Overal bed mobility: Needs Assistance Bed Mobility: Supine to Sit;Sit to Supine     Supine to sit: Min assist Sit to supine: Min assist   General bed mobility comments: Pt performed increased activity but required min assistance to advance LEs to edge of bed and back into bed.    Transfers Overall transfer level: Needs assistance Equipment used: Rolling walker (2 wheeled) Transfers: Sit to/from Stand Sit to Stand: Min guard         General transfer comment: Cues for hand placement to and from seated  surface.  Pt slow and guarded due to pain.  Pt required cues to advance LLE forward to ease pain when returning to seated position.    Ambulation/Gait Ambulation/Gait assistance: Min assist Gait Distance (Feet): 30 Feet Assistive device: Rolling walker (2 wheeled)(bari RW) Gait Pattern/deviations: Antalgic;Step-to pattern;Shuffle;Decreased stance time - left;Decreased step length - right     General Gait Details: Cues for weight shifting to L and to performed step to pattern with symmetry.  Pt with improved sequencing without use of KI.     Stairs             Wheelchair Mobility    Modified Rankin (Stroke Patients Only)       Balance Overall balance assessment: Needs assistance   Sitting balance-Leahy Scale: Good       Standing balance-Leahy Scale: Poor                              Cognition Arousal/Alertness: Awake/alert Behavior During Therapy: WFL for tasks assessed/performed Overall Cognitive Status: Within Functional Limits for tasks assessed                                        Exercises Total Joint Exercises Ankle Circles/Pumps: AROM;Left;10 reps;Supine Quad Sets: AROM;Left;10 reps;Supine Heel Slides: AAROM;Both;10 reps;Supine Hip ABduction/ADduction: AAROM;Left;10 reps;Supine Straight Leg Raises: Right;Supine;AAROM;10 reps    General Comments        Pertinent Vitals/Pain Pain Assessment: 0-10 Pain  Score: 6  Pain Location: Lt knee, Pain Descriptors / Indicators: Aching;Operative site guarding Pain Intervention(s): Monitored during session;Repositioned;Ice applied    Home Living                      Prior Function            PT Goals (current goals can now be found in the care plan section) Acute Rehab PT Goals Patient Stated Goal: "to decrease pain"  Potential to Achieve Goals: Good Progress towards PT goals: Progressing toward goals    Frequency    7X/week      PT Plan Current plan  remains appropriate    Co-evaluation              AM-PAC PT "6 Clicks" Daily Activity  Outcome Measure  Difficulty turning over in bed (including adjusting bedclothes, sheets and blankets)?: A Lot Difficulty moving from lying on back to sitting on the side of the bed? : Unable Difficulty sitting down on and standing up from a chair with arms (e.g., wheelchair, bedside commode, etc,.)?: Unable Help needed moving to and from a bed to chair (including a wheelchair)?: A Lot Help needed walking in hospital room?: A Lot Help needed climbing 3-5 steps with a railing? : Total 6 Click Score: 9    End of Session Equipment Utilized During Treatment: Gait belt Activity Tolerance: Patient tolerated treatment well;Patient limited by pain;Patient limited by fatigue Patient left: with call bell/phone within reach;in bed;Other (comment)(returned to bed as patient awating doppler study.  ) Nurse Communication: Mobility status PT Visit Diagnosis: Unsteadiness on feet (R26.81);Other abnormalities of gait and mobility (R26.89);Other symptoms and signs involving the nervous system (R29.898);Pain Pain - Right/Left: Left Pain - part of body: Knee     Time: [id number] PT Time Calculation (min) (ACUTE ONLY): 32 min  Charges:  $Gait Training: 8-22 mins $Therapeutic Exercise: 8-22 mins                     Joycelyn RuaAimee Torin Whisner, PTA pager [phone]    Florestine AversAimee J Dylanie Quesenberry 11/10/2017, 11:01 AM

## 2017-11-10 NOTE — Progress Notes (Signed)
Ace wrap removed from Left knee and lower leg per Dr. Diamantina Providenceean's orders.

## 2017-11-11 LAB — GLUCOSE, CAPILLARY
Glucose-Capillary: 132 mg/dL — ABNORMAL HIGH (ref 70–99)
Glucose-Capillary: 120 mg/dL — ABNORMAL HIGH (ref 70–99)

## 2017-11-11 NOTE — Plan of Care (Signed)
  Problem: Clinical Measurements: Goal: Ability to maintain clinical measurements within normal limits will improve Outcome: Progressing Goal: Will remain free from infection Outcome: Progressing Goal: Diagnostic test results will improve Outcome: Progressing Goal: Respiratory complications will improve Outcome: Progressing Goal: Cardiovascular complication will be avoided Outcome: Progressing   Problem: Activity: Goal: Risk for activity intolerance will decrease Outcome: Progressing   Problem: Pain Managment: Goal: General experience of comfort will improve Outcome: Progressing   Problem: Safety: Goal: Ability to remain free from injury will improve Outcome: Progressing   

## 2017-11-11 NOTE — Progress Notes (Signed)
Orthopedic Tech Progress Note Patient Details:  Adam KillianCarl K [name] 10-23-1965 [id number]  Patient ID: Adam [name], male   DOB: 10-23-1965, 52 y.o.   MRN: [id number] Pt will call when ready for cpm.  Trinna PostMartinez, Adam Wakeman J 11/11/2017, 6:10 AM

## 2017-11-11 NOTE — Progress Notes (Signed)
Physical Therapy Treatment Patient Details Name: Adam KillianCarl K Guzzo MRN: [id number] DOB: [birth year] Today's Date: 11/11/2017    History of Present Illness Pt is a 52 y/o male s/p elective L TKA. Pt also with continuous femoral nerve block. PMH includes HTN, DVT, tobacco abuse, spinal cord stimulator implant.     PT Comments    Pt progressing with post-op mobility. Pt describing his LLE as "dead" however is able to perform some active movement during mobility and transfers. Gait continues to be antalgic with significant weight through UE's on walker for support. Recommended another PT session this afternoon however pt declined, stating "I would rather just go home now and not get so worn out I can't get in the house." RN notified. If pt decides he would like to see PT again prior to d/c, please page [phone] and I will try to get back to him today if able.   Follow Up Recommendations  Follow surgeon's recommendation for DC plan and follow-up therapies;Supervision for mobility/OOB;SNF     Equipment Recommendations  None recommended by PT    Recommendations for Other Services OT consult     Precautions / Restrictions Precautions Precautions: Knee;Fall Precaution Comments: Reviewed knee precautions and use of bone foam. Continuous femoral nerve block Required Braces or Orthoses: Knee Immobilizer - Left(Did not use for gait training per pt request) Knee Immobilizer - Left: Other (comment)(L KI on for sleep) Restrictions Weight Bearing Restrictions: Yes LLE Weight Bearing: Weight bearing as tolerated    Mobility  Bed Mobility Overal bed mobility: Needs Assistance Bed Mobility: Supine to Sit;Sit to Supine     Supine to sit: Min assist Sit to supine: Min assist   General bed mobility comments: Assist for LLE advancement towards EOB and elevation to return to supine at end of session. Pt instructed in using R foot under L ankle to assist with moving, however pt had difficulty without  assistance.   Transfers Overall transfer level: Needs assistance Equipment used: Rolling walker (2 wheeled) Transfers: Sit to/from Stand Sit to Stand: Min guard         General transfer comment: Close guard for safety as pt powered up to full stand. No physical assist required. VC's for hand placement on seated surface for safety.   Ambulation/Gait Ambulation/Gait assistance: Min guard Gait Distance (Feet): 90 Feet Assistive device: Rolling walker (2 wheeled)(Bariatric) Gait Pattern/deviations: Antalgic;Step-to pattern;Shuffle;Decreased stance time - left;Decreased step length - right Gait velocity: Decreased Gait velocity interpretation: <1.31 ft/sec, indicative of household ambulator General Gait Details: Cues for weight shifting to L and to performed step to pattern with symmetry. Noted pt improved without use of KI last PT session so did not use today per pt request. No buckling noted, however pt with increased support through UE's on walker.    Stairs             Wheelchair Mobility    Modified Rankin (Stroke Patients Only)       Balance Overall balance assessment: Needs assistance Sitting-balance support: No upper extremity supported;Feet supported Sitting balance-Leahy Scale: Good     Standing balance support: Bilateral upper extremity supported;During functional activity Standing balance-Leahy Scale: Poor Standing balance comment: Reliant on UE support for balance                            Cognition Arousal/Alertness: Awake/alert Behavior During Therapy: WFL for tasks assessed/performed Overall Cognitive Status: Within Functional Limits for tasks assessed  Exercises Total Joint Exercises Goniometric ROM: 68 flexion AAROM in sitting; L knee Other Exercises Other Exercises: Reviewed HEP verbally. Pt with increased muscle spasm and pain after gait training and demonstration of HEP  deferred.     General Comments        Pertinent Vitals/Pain Pain Assessment: Faces Faces Pain Scale: Hurts even more Pain Location: Left knee and quads/hams Pain Descriptors / Indicators: Operative site guarding;Spasm Pain Intervention(s): Limited activity within patient's tolerance;Monitored during session;Repositioned    Home Living                      Prior Function            PT Goals (current goals can now be found in the care plan section) Acute Rehab PT Goals Patient Stated Goal: "I want to go home this morning, not this afternoon." PT Goal Formulation: With patient Time For Goal Achievement: 11/21/17 Potential to Achieve Goals: Good Progress towards PT goals: Progressing toward goals    Frequency    7X/week      PT Plan Current plan remains appropriate    Co-evaluation              AM-PAC PT "6 Clicks" Daily Activity  Outcome Measure  Difficulty turning over in bed (including adjusting bedclothes, sheets and blankets)?: A Little Difficulty moving from lying on back to sitting on the side of the bed? : Unable Difficulty sitting down on and standing up from a chair with arms (e.g., wheelchair, bedside commode, etc,.)?: A Lot Help needed moving to and from a bed to chair (including a wheelchair)?: A Little Help needed walking in hospital room?: A Little Help needed climbing 3-5 steps with a railing? : A Lot 6 Click Score: 14    End of Session Equipment Utilized During Treatment: Gait belt Activity Tolerance: Patient limited by pain Patient left: in bed;with call bell/phone within reach Nurse Communication: Mobility status(Pt request to d/c this morning) PT Visit Diagnosis: Unsteadiness on feet (R26.81);Other abnormalities of gait and mobility (R26.89);Other symptoms and signs involving the nervous system (R29.898);Pain Pain - Right/Left: Left Pain - part of body: Knee     Time: 0981-1914 PT Time Calculation (min) (ACUTE ONLY): 29  min  Charges:  $Gait Training: 23-37 mins                     Conni Slipper, PT, DPT Acute Rehabilitation Services Pager: [phone]    Marylynn Pearson 11/11/2017, 11:37 AM

## 2017-11-11 NOTE — Discharge Summary (Signed)
Discharge Diagnoses:  Active Problems:   Arthritis of knee   Unilateral primary osteoarthritis, left knee   Surgeries: Procedure(s): LEFT TOTAL KNEE ARTHROPLASTY on 11/07/2017    Consultants:   Discharged Condition: Improved  Hospital Course: Adam KillianCarl K Tani is an 52 y.o. male who was admitted 11/07/2017 with a chief complaint of left knee osteoarthritis, with a final diagnosis of LEFT KNEE OSTEOARTHRITIS.  Patient was brought to the operating room on 11/07/2017 and underwent Procedure(s): LEFT TOTAL KNEE ARTHROPLASTY.    Patient was given perioperative antibiotics:  Anti-infectives (From admission, onward)   Start     Dose/Rate Route Frequency Ordered Stop   11/07/17 0630  ceFAZolin (ANCEF) 3 g in dextrose 5 % 50 mL IVPB     3 g 100 mL/hr over 30 Minutes Intravenous To [facility]Stay Surgical 11/06/17 1114 11/07/17 0804    .  Patient was given sequential compression devices, early ambulation, and aspirin for DVT prophylaxis.  Recent vital signs:  Patient Vitals for the past 24 hrs:  BP Temp Temp src Pulse Resp SpO2  11/11/17 0517 113/70 98.4 F (36.9 C) Oral 75 - 96 %  11/10/17 2002 137/67 98.4 F (36.9 C) Oral 80 - 97 %  11/10/17 1358 137/69 98.6 F (37 C) Oral 85 17 97 %  .  Recent laboratory studies: No results found.  Discharge Medications:   Allergies as of 11/11/2017      Reactions   Augmentin [amoxicillin-pot Clavulanate] Nausea And Vomiting   "Possible reaction"      Medication List    TAKE these medications   aspirin EC 81 MG tablet Take 1 tablet (81 mg total) by mouth 2 (two) times daily.   hydrOXYzine 10 MG tablet Commonly known as:  ATARAX/VISTARIL Take 10 mg by mouth 3 (three) times daily as needed for anxiety.   levothyroxine 112 MCG tablet Commonly known as:  SYNTHROID, LEVOTHROID Take 112 mcg by mouth daily before breakfast.   liraglutide 18 MG/3ML Sopn Commonly known as:  VICTOZA Inject 1.2 mg into the skin every morning.    lisinopril-hydrochlorothiazide 20-25 MG tablet Commonly known as:  PRINZIDE,ZESTORETIC Take 1 tablet by mouth daily.   metFORMIN 500 MG tablet Commonly known as:  GLUCOPHAGE Take 1,000 mg by mouth 2 (two) times daily with a meal.   morphine 30 MG 12 hr tablet Commonly known as:  MS CONTIN Take 30 mg by mouth every 8 (eight) hours as needed.   oxycodone 30 MG immediate release tablet Commonly known as:  ROXICODONE Take 30 mg by mouth 2 (two) times daily as needed.   pravastatin 40 MG tablet Commonly known as:  PRAVACHOL Take 40 mg by mouth daily.   sertraline 50 MG tablet Commonly known as:  ZOLOFT Take 50 mg by mouth daily.   zolpidem 10 MG tablet Commonly known as:  AMBIEN Take 10 mg by mouth at bedtime as needed for sleep.       Diagnostic Studies: No results found.  Patient benefited maximally from their hospital stay and there were no complications.     Disposition: Discharge disposition: 01-Home or Self Care      Discharge Instructions    Call MD / Call 911   Complete by:  As directed    If you experience chest pain or shortness of breath, CALL 911 and be transported to the hospital emergency room.  If you develope a fever above 101 F, pus (white drainage) or increased drainage or redness at the wound, or calf pain,  call your surgeon's office.   Call MD / Call 911   Complete by:  As directed    If you experience chest pain or shortness of breath, CALL 911 and be transported to the hospital emergency room.  If you develope a fever above 101 F, pus (white drainage) or increased drainage or redness at the wound, or calf pain, call your surgeon's office.   Constipation Prevention   Complete by:  As directed    Drink plenty of fluids.  Prune juice may be helpful.  You may use a stool softener, such as Colace (over the counter) 100 mg twice a day.  Use MiraLax (over the counter) for constipation as needed.   Constipation Prevention   Complete by:  As directed     Drink plenty of fluids.  Prune juice may be helpful.  You may use a stool softener, such as Colace (over the counter) 100 mg twice a day.  Use MiraLax (over the counter) for constipation as needed.   Diet - low sodium heart healthy   Complete by:  As directed    Diet - low sodium heart healthy   Complete by:  As directed    Discharge instructions   Complete by:  As directed    Okay to shower dressing waterproof Weightbearing as tolerated left lower extremity CPM machine 1 hour 3 times a day to increase range of motion   Increase activity slowly as tolerated   Complete by:  As directed    Increase activity slowly as tolerated   Complete by:  As directed      Follow-up Information    [facility] Follow up.   Specialty:  Home Health Services Why:  A representative from [facility] will contact you to arrange start date and time for your therapy.  Contact information: [address] [place] [address] [phone]        Cammy Copa, MD Follow up in 1 week(s).   Specialty:  Orthopedic Surgery Contact information: [address] [place] [address] [phone]            Signed: Nadara Mustard 11/11/2017, 11:28 AM

## 2017-11-11 NOTE — Plan of Care (Signed)
Problem: Education: Goal: Knowledge of General Education information will improve Description Including pain rating scale, medication(s)/side effects and non-pharmacologic comfort measures 11/11/2017 1156 by Darreld Mclean, RN Outcome: Adequate for Discharge 11/11/2017 1051 by Darreld Mclean, RN Outcome: Progressing   Problem: Health Behavior/Discharge Planning: Goal: Ability to manage health-related needs will improve 11/11/2017 1156 by Darreld Mclean, RN Outcome: Adequate for Discharge 11/11/2017 1051 by Darreld Mclean, RN Outcome: Progressing   Problem: Clinical Measurements: Goal: Ability to maintain clinical measurements within normal limits will improve 11/11/2017 1156 by Darreld Mclean, RN Outcome: Adequate for Discharge 11/11/2017 1051 by Darreld Mclean, RN Outcome: Progressing Goal: Will remain free from infection 11/11/2017 1156 by Darreld Mclean, RN Outcome: Adequate for Discharge 11/11/2017 1051 by Darreld Mclean, RN Outcome: Progressing Goal: Diagnostic test results will improve 11/11/2017 1156 by Darreld Mclean, RN Outcome: Adequate for Discharge 11/11/2017 1051 by Darreld Mclean, RN Outcome: Progressing Goal: Respiratory complications will improve 11/11/2017 1156 by Darreld Mclean, RN Outcome: Adequate for Discharge 11/11/2017 1051 by Darreld Mclean, RN Outcome: Progressing Goal: Cardiovascular complication will be avoided 11/11/2017 1156 by Darreld Mclean, RN Outcome: Adequate for Discharge 11/11/2017 1051 by Darreld Mclean, RN Outcome: Progressing   Problem: Activity: Goal: Risk for activity intolerance will decrease 11/11/2017 1156 by Darreld Mclean, RN Outcome: Adequate for Discharge 11/11/2017 1051 by Darreld Mclean, RN Outcome: Progressing   Problem: Nutrition: Goal: Adequate nutrition will be maintained 11/11/2017 1156 by Darreld Mclean, RN Outcome: Adequate for Discharge 11/11/2017 1051 by Darreld Mclean, RN Outcome: Progressing   Problem: Coping: Goal: Level of anxiety will decrease 11/11/2017 1156 by Darreld Mclean, RN Outcome: Adequate for Discharge 11/11/2017  1051 by Darreld Mclean, RN Outcome: Progressing   Problem: Elimination: Goal: Will not experience complications related to bowel motility 11/11/2017 1156 by Darreld Mclean, RN Outcome: Adequate for Discharge 11/11/2017 1051 by Darreld Mclean, RN Outcome: Progressing Goal: Will not experience complications related to urinary retention 11/11/2017 1156 by Darreld Mclean, RN Outcome: Adequate for Discharge 11/11/2017 1051 by Darreld Mclean, RN Outcome: Progressing   Problem: Pain Managment: Goal: General experience of comfort will improve 11/11/2017 1156 by Darreld Mclean, RN Outcome: Adequate for Discharge 11/11/2017 1051 by Darreld Mclean, RN Outcome: Progressing   Problem: Safety: Goal: Ability to remain free from injury will improve 11/11/2017 1156 by Darreld Mclean, RN Outcome: Adequate for Discharge 11/11/2017 1051 by Darreld Mclean, RN Outcome: Progressing   Problem: Skin Integrity: Goal: Risk for impaired skin integrity will decrease 11/11/2017 1156 by Darreld Mclean, RN Outcome: Adequate for Discharge 11/11/2017 1051 by Darreld Mclean, RN Outcome: Progressing   Problem: Education: Goal: Knowledge of the prescribed therapeutic regimen will improve 11/11/2017 1156 by Darreld Mclean, RN Outcome: Adequate for Discharge 11/11/2017 1051 by Darreld Mclean, RN Outcome: Progressing Goal: Individualized Educational Video(s) 11/11/2017 1156 by Darreld Mclean, RN Outcome: Adequate for Discharge 11/11/2017 1051 by Darreld Mclean, RN Outcome: Progressing   Problem: Activity: Goal: Ability to avoid complications of mobility impairment will improve 11/11/2017 1156 by Darreld Mclean, RN Outcome: Adequate for Discharge 11/11/2017 1051 by Darreld Mclean, RN Outcome: Progressing Goal: Range of joint motion will improve 11/11/2017 1156 by Darreld Mclean, RN Outcome: Adequate for Discharge 11/11/2017 1051 by Darreld Mclean, RN Outcome: Progressing   Problem: Clinical Measurements: Goal: Postoperative complications will be avoided or minimized 11/11/2017 1156 by Darreld Mclean, RN Outcome: Adequate for  Discharge 11/11/2017 1051 by Darreld Mclean, RN Outcome: Progressing   Problem: Pain Management: Goal: Pain level will decrease with appropriate interventions 11/11/2017 1156 by Darreld Mclean, RN Outcome: Adequate for  Discharge 11/11/2017 1051 by Darreld Mcleanox, Marletta Bousquet, RN Outcome: Progressing   Problem: Skin Integrity: Goal: Will show signs of wound healing 11/11/2017 1156 by Darreld Mcleanox, Timeka Goette, RN Outcome: Adequate for Discharge 11/11/2017 1051 by Darreld Mcleanox, Waylin Dorko, RN Outcome: Progressing

## 2017-11-13 ENCOUNTER — Telehealth (INDEPENDENT_AMBULATORY_CARE_PROVIDER_SITE_OTHER): Payer: Self-pay | Admitting: Orthopedic Surgery

## 2017-11-13 MED ORDER — METHOCARBAMOL 500 MG PO TABS: ORAL_TABLET | ORAL | 0 refills | Status: DC

## 2017-11-13 NOTE — Telephone Encounter (Signed)
I called patient and advised. He states that he is drinking coconut water and eating bananas with no relief. He is very hydrated and was given muscle relaxers in the hospital which seemed to help. He feels that there are hundreds of shocks going off in the femoral nerve that was blocked.   Per Dr. Lajoyce Cornersuda, ok for Robaxin 500mg  1 po bid prn spasms #20 use sparingly. Sent to pharmacy. I called patient and advised.

## 2017-11-13 NOTE — Telephone Encounter (Signed)
He should take coconut water 4 oz daily to restore electrolytes to relieve cramps

## 2017-11-13 NOTE — Telephone Encounter (Signed)
Can you advise since Dr. Dean is out of the office? Thanks. 

## 2017-11-13 NOTE — Telephone Encounter (Signed)
Patient called stating he is having really bad muscle spasm and cramping in his left leg. Patient asked what should he do. Patient said it's after he does (PT) The number to contact patient is [phone] or [phone]

## 2017-11-13 NOTE — Telephone Encounter (Signed)
I called Maria and advised. 

## 2017-11-13 NOTE — Telephone Encounter (Signed)
Adam [name] (PT) with [facility] called needing verbal orders for HHPT 4 wk 1 and 1 wk 1   The number to contact Adam [name] is [phone]

## 2017-11-22 ENCOUNTER — Ambulatory Visit (INDEPENDENT_AMBULATORY_CARE_PROVIDER_SITE_OTHER): Payer: Medicare Other

## 2017-11-22 ENCOUNTER — Ambulatory Visit (INDEPENDENT_AMBULATORY_CARE_PROVIDER_SITE_OTHER): Payer: Medicare Other | Admitting: Orthopedic Surgery

## 2017-11-22 ENCOUNTER — Encounter (INDEPENDENT_AMBULATORY_CARE_PROVIDER_SITE_OTHER): Payer: Self-pay | Admitting: Orthopedic Surgery

## 2017-11-22 DIAGNOSIS — Z96652 Presence of left artificial knee joint: Secondary | ICD-10-CM | POA: Diagnosis not present

## 2017-11-26 ENCOUNTER — Encounter (INDEPENDENT_AMBULATORY_CARE_PROVIDER_SITE_OTHER): Payer: Self-pay | Admitting: Orthopedic Surgery

## 2017-11-26 NOTE — Progress Notes (Signed)
Post-Op Visit Note   Patient: Adam [name]           Date of Birth: 04/11/1966           MRN: [id number] Visit Date: 11/22/2017 PCP: Jarrett SohoWharton, Courtney, PA-C   Assessment & Plan:  Chief Complaint:  Chief Complaint  Patient presents with  . Left Knee - Routine Post Op   Visit Diagnoses:  1. S/P total knee arthroplasty, left     Plan: Adam [name] is a patient with left total knee replacement.  Done 2 weeks ago.  On exam the incision looks intact.  He is lacking about 15 degrees of full extension but has flexion close to 90.  Radiographs look good.  Ultrasound was negative for DVT.  He is on aspirin 81 mg twice a day.  No calf tenderness on exam today.  Plan is to start outpatient PT and to really focus on full extension.  4-week return for clinical recheck.  Follow-Up Instructions: No follow-ups on file.   Orders:  Orders Placed This Encounter  Procedures  . XR Knee 1-2 Views Left  . Ambulatory referral to Physical Therapy   No orders of the defined types were placed in this encounter.   Imaging: No results found.  PMFS History: Patient Active Problem List   Diagnosis Date Noted  . Arthritis of knee 11/07/2017  . Unilateral primary osteoarthritis, left knee   . GERD (gastroesophageal reflux disease) 09/25/2015  . Superficial thrombophlebitis of left leg 07/28/2015  . Tobacco abuse counseling 07/28/2015  . Morbid obesity due to excess calories (HCC) 07/28/2015  . Financial difficulties 07/28/2015  . Left peroneal vein thrombosis (HCC) 07/13/2015   Past Medical History:  Diagnosis Date  . Arthritis   . Chronic pain   . Diabetes mellitus without complication (HCC)   . Full dentures   . GERD (gastroesophageal reflux disease)   . Hyperlipemia   . Hypertension   . Hypothyroidism   . Peripheral neuropathic pain   . Pleurisy    as a kid; had lung drained. Scar tissue on the right lung.   . Sleep apnea    hx-had corrective surgery  . Spinal cord stimulator status     Family History  Problem Relation Age of Onset  . Clotting disorder Mother     Past Surgical History:  Procedure Laterality Date  . ANKLE ARTHROPLASTY  2005   right ankle injury  . CHONDROPLASTY Left 04/14/2014   Procedure: CHONDROPLASTY Patella and tibial condyle;  Surgeon: Nestor LewandowskyFrank J Rowan, MD;  Location: [facility];  Service: Orthopedics;  Laterality: Left;  . I&D EXTREMITY  2005   right ankle  . KNEE ARTHROSCOPY     left x2  . KNEE ARTHROSCOPY Left 04/14/2014   Procedure: LEFT KNEE ARTHROSCOPY removal loose body;  Surgeon: Nestor LewandowskyFrank J Rowan, MD;  Location: [facility];  Service: Orthopedics;  Laterality: Left;  . KNEE ARTHROSCOPY WITH LATERAL MENISECTOMY Left 04/14/2014   Procedure: KNEE ARTHROSCOPY WITH LATERAL MENISECTOMY;  Surgeon: Nestor LewandowskyFrank J Rowan, MD;  Location: [facility];  Service: Orthopedics;  Laterality: Left;  . KNEE ARTHROSCOPY WITH MEDIAL MENISECTOMY Left 04/14/2014   Procedure: KNEE ARTHROSCOPY WITH MEDIAL MENISECTOMY;  Surgeon: Nestor LewandowskyFrank J Rowan, MD;  Location:  SURGERY CENTER;  Service: Orthopedics;  Laterality: Left;  Marland Kitchen. MULTIPLE TOOTH EXTRACTIONS  2015  . SPINAL CORD STIMULATOR IMPLANT  2008  . SPINAL CORD STIMULATOR IMPLANT  2008   revision  . TOTAL KNEE ARTHROPLASTY  Left 11/07/2017  . TOTAL KNEE ARTHROPLASTY Left 11/07/2017   Procedure: LEFT TOTAL KNEE ARTHROPLASTY;  Surgeon: Cammy Copaean, Amandajo Gonder Scott, MD;  [facility];  Service: Orthopedics;  Laterality: Left;  . UVULOPALATOPHARYNGOPLASTY  2000   Social History   Occupational History  . Not on file  Tobacco Use  . Smoking status: Current Every Day Smoker    Packs/day: 1.00    Years: 21.00    Pack years: 21.00  . Smokeless tobacco: Never Used  Substance and Sexual Activity  . Alcohol use: Yes    Comment: rare  . Drug use: No  . Sexual activity: Not on file

## 2017-11-29 ENCOUNTER — Telehealth (INDEPENDENT_AMBULATORY_CARE_PROVIDER_SITE_OTHER): Payer: Self-pay | Admitting: Orthopedic Surgery

## 2017-11-29 NOTE — Telephone Encounter (Signed)
Joellen from [facility] called stating that they have an appointment for this patient but are needing the last office notes faxed to [phone].  Thank you.

## 2017-11-29 NOTE — Telephone Encounter (Signed)
Faxed

## 2017-12-25 ENCOUNTER — Encounter (INDEPENDENT_AMBULATORY_CARE_PROVIDER_SITE_OTHER): Payer: Self-pay | Admitting: Orthopedic Surgery

## 2017-12-25 ENCOUNTER — Ambulatory Visit (INDEPENDENT_AMBULATORY_CARE_PROVIDER_SITE_OTHER): Payer: Medicare Other | Admitting: Orthopedic Surgery

## 2017-12-25 DIAGNOSIS — Z96652 Presence of left artificial knee joint: Secondary | ICD-10-CM

## 2017-12-26 ENCOUNTER — Encounter (INDEPENDENT_AMBULATORY_CARE_PROVIDER_SITE_OTHER): Payer: Self-pay | Admitting: Orthopedic Surgery

## 2017-12-26 NOTE — Progress Notes (Signed)
Post-Op Visit Note   Patient: Adam [name]           Date of Birth: 10/29/1965           MRN: [id number] Visit Date: 12/25/2017 PCP: Jarrett Soho, PA-C   Assessment & Plan:  Chief Complaint:  Chief Complaint  Patient presents with  . Left Knee - Routine Post Op   Visit Diagnoses:  1. S/P total knee arthroplasty, left     Plan: Eula is a patient who is now about 6 weeks out left total knee replacement.  He is doing home exercise program 6 times a day.  Physical therapy 1-2 times a week.  On exam he is got about a 10 degree flexion contracture but the bend is to about 90.  Plan is to continue home exercise program and physical therapy really to work on full extension.  Heel lift to the right to facilitate left leg extension.  8-week return for repeat assessment.  He is in pain management at this time.  Follow-Up Instructions: Return in about 8 weeks (around 02/19/2018).   Orders:  No orders of the defined types were placed in this encounter.  No orders of the defined types were placed in this encounter.   Imaging: No results found.  PMFS History: Patient Active Problem List   Diagnosis Date Noted  . Arthritis of knee 11/07/2017  . Unilateral primary osteoarthritis, left knee   . GERD (gastroesophageal reflux disease) 09/25/2015  . Superficial thrombophlebitis of left leg 07/28/2015  . Tobacco abuse counseling 07/28/2015  . Morbid obesity due to excess calories (HCC) 07/28/2015  . Financial difficulties 07/28/2015  . Left peroneal vein thrombosis (HCC) 07/13/2015   Past Medical History:  Diagnosis Date  . Arthritis   . Chronic pain   . Diabetes mellitus without complication (HCC)   . Full dentures   . GERD (gastroesophageal reflux disease)   . Hyperlipemia   . Hypertension   . Hypothyroidism   . Peripheral neuropathic pain   . Pleurisy    as a kid; had lung drained. Scar tissue on the right lung.   . Sleep apnea    hx-had corrective surgery  .  Spinal cord stimulator status     Family History  Problem Relation Age of Onset  . Clotting disorder Mother     Past Surgical History:  Procedure Laterality Date  . ANKLE ARTHROPLASTY  2005   right ankle injury  . CHONDROPLASTY Left 04/14/2014   Procedure: CHONDROPLASTY Patella and tibial condyle;  Surgeon: Nestor Lewandowsky, MD;  Location: [facility];  Service: Orthopedics;  Laterality: Left;  . I&D EXTREMITY  2005   right ankle  . KNEE ARTHROSCOPY     left x2  . KNEE ARTHROSCOPY Left 04/14/2014   Procedure: LEFT KNEE ARTHROSCOPY removal loose body;  Surgeon: Nestor Lewandowsky, MD;  Location: [facility];  Service: Orthopedics;  Laterality: Left;  . KNEE ARTHROSCOPY WITH LATERAL MENISECTOMY Left 04/14/2014   Procedure: KNEE ARTHROSCOPY WITH LATERAL MENISECTOMY;  Surgeon: Nestor Lewandowsky, MD;  Location: [facility];  Service: Orthopedics;  Laterality: Left;  . KNEE ARTHROSCOPY WITH MEDIAL MENISECTOMY Left 04/14/2014   Procedure: KNEE ARTHROSCOPY WITH MEDIAL MENISECTOMY;  Surgeon: Nestor Lewandowsky, MD;  Location: [facility];  Service: Orthopedics;  Laterality: Left;  Marland Kitchen MULTIPLE TOOTH EXTRACTIONS  2015  . SPINAL CORD STIMULATOR IMPLANT  2008  . SPINAL CORD STIMULATOR IMPLANT  2008   revision  .  TOTAL KNEE ARTHROPLASTY Left 11/07/2017  . TOTAL KNEE ARTHROPLASTY Left 11/07/2017   Procedure: LEFT TOTAL KNEE ARTHROPLASTY;  Surgeon: Cammy Copaean, Gregory Scott, MD;  [facility];  Service: Orthopedics;  Laterality: Left;  . UVULOPALATOPHARYNGOPLASTY  2000   Social History   Occupational History  . Not on file  Tobacco Use  . Smoking status: Current Every Day Smoker    Packs/day: 1.00    Years: 21.00    Pack years: 21.00  . Smokeless tobacco: Never Used  Substance and Sexual Activity  . Alcohol use: Yes    Comment: rare  . Drug use: No  . Sexual activity: Not on file

## 2018-02-26 ENCOUNTER — Encounter (INDEPENDENT_AMBULATORY_CARE_PROVIDER_SITE_OTHER): Payer: Self-pay | Admitting: Orthopedic Surgery

## 2018-02-26 ENCOUNTER — Ambulatory Visit (INDEPENDENT_AMBULATORY_CARE_PROVIDER_SITE_OTHER): Payer: Medicare Other | Admitting: Orthopedic Surgery

## 2018-02-26 DIAGNOSIS — G8929 Other chronic pain: Secondary | ICD-10-CM | POA: Diagnosis not present

## 2018-02-26 DIAGNOSIS — M25562 Pain in left knee: Secondary | ICD-10-CM | POA: Diagnosis not present

## 2018-02-26 DIAGNOSIS — Z96652 Presence of left artificial knee joint: Secondary | ICD-10-CM

## 2018-02-26 NOTE — Progress Notes (Signed)
Office Visit Note   Patient: Adam KillianCarl K [name]           Date of Birth: 1965-04-22           MRN: [id number] Visit Date: 02/26/2018 Requested by: Jarrett SohoWharton, Courtney, PA-C [address] [place], [place] [address] PCP: Jarrett SohoWharton, Courtney, PA-C  Subjective: Chief Complaint  Patient presents with  . Left Knee - Follow-up    HPI: Adam [name] is a patient is now 4 months out left total knee replacement.  Been doing well.  Has been going to therapy because he is been dealing [place].  He has been doing a home exercise program.  He has not noticed much in terms of diminishing pain medicine requirement because of his right ankle issues.              ROS: All systems reviewed are negative as they relate to the chief complaint within the history of present illness.  Patient denies  fevers or chills.   Assessment & Plan: Visit Diagnoses:  1. S/P total knee arthroplasty, left   2. Chronic pain of left knee     Plan: Impression is right ankle pain with well-functioning left knee replacement.  Needs prophylactic antibiotics before any further interventional procedures.  I will see him back as needed.  That antibiotic coverage time will go for 3 years to 2 years.  Follow-Up Instructions: Return if symptoms worsen or fail to improve.   Orders:  No orders of the defined types were placed in this encounter.  No orders of the defined types were placed in this encounter.     Procedures: No procedures performed   Clinical Data: No additional findings.  Objective: Vital Signs: There were no vitals taken for this visit.  Physical Exam:   Constitutional: Patient appears well-developed HEENT:  Head: Normocephalic Eyes:EOM are normal Neck: Normal range of motion Cardiovascular: Normal rate Pulmonary/chest: Effort normal Neurologic: Patient is alert Skin: Skin is warm Psychiatric: Patient has normal mood and affect    Ortho Exam: Ortho exam demonstrates range of  motion from about 8-100 with slightly antalgic gait to the left no warmth or effusion to the left knee.  Extensor mechanism is well-functioning.  Specialty Comments:  No specialty comments available.  Imaging: No results found.   PMFS History: Patient Active Problem List   Diagnosis Date Noted  . Arthritis of knee 11/07/2017  . Unilateral primary osteoarthritis, left knee   . GERD (gastroesophageal reflux disease) 09/25/2015  . Superficial thrombophlebitis of left leg 07/28/2015  . Tobacco abuse counseling 07/28/2015  . Morbid obesity due to excess calories (HCC) 07/28/2015  . Financial difficulties 07/28/2015  . Left peroneal vein thrombosis 07/13/2015   Past Medical History:  Diagnosis Date  . Arthritis   . Chronic pain   . Diabetes mellitus without complication (HCC)   . Full dentures   . GERD (gastroesophageal reflux disease)   . Hyperlipemia   . Hypertension   . Hypothyroidism   . Peripheral neuropathic pain   . Pleurisy    as a kid; had lung drained. Scar tissue on the right lung.   . Sleep apnea    hx-had corrective surgery  . Spinal cord stimulator status     Family History  Problem Relation Age of Onset  . Clotting disorder Mother     Past Surgical History:  Procedure Laterality Date  . ANKLE ARTHROPLASTY  2005   right ankle injury  . CHONDROPLASTY Left 04/14/2014  Procedure: CHONDROPLASTY Patella and tibial condyle;  Surgeon: Nestor Lewandowsky, MD;  Location: [facility];  Service: Orthopedics;  Laterality: Left;  . I&D EXTREMITY  2005   right ankle  . KNEE ARTHROSCOPY     left x2  . KNEE ARTHROSCOPY Left 04/14/2014   Procedure: LEFT KNEE ARTHROSCOPY removal loose body;  Surgeon: Nestor Lewandowsky, MD;  Location: [facility];  Service: Orthopedics;  Laterality: Left;  . KNEE ARTHROSCOPY WITH LATERAL MENISECTOMY Left 04/14/2014   Procedure: KNEE ARTHROSCOPY WITH LATERAL MENISECTOMY;  Surgeon: Nestor Lewandowsky, MD;  Location: [facility];  Service: Orthopedics;  Laterality: Left;  . KNEE ARTHROSCOPY WITH MEDIAL MENISECTOMY Left 04/14/2014   Procedure: KNEE ARTHROSCOPY WITH MEDIAL MENISECTOMY;  Surgeon: Nestor Lewandowsky, MD;  Location: [facility];  Service: Orthopedics;  Laterality: Left;  Marland Kitchen MULTIPLE TOOTH EXTRACTIONS  2015  . SPINAL CORD STIMULATOR IMPLANT  2008  . SPINAL CORD STIMULATOR IMPLANT  2008   revision  . TOTAL KNEE ARTHROPLASTY Left 11/07/2017  . TOTAL KNEE ARTHROPLASTY Left 11/07/2017   Procedure: LEFT TOTAL KNEE ARTHROPLASTY;  Surgeon: Cammy Copa, MD;  [facility];  Service: Orthopedics;  Laterality: Left;  . UVULOPALATOPHARYNGOPLASTY  2000   Social History   Occupational History  . Not on file  Tobacco Use  . Smoking status: Current Every Day Smoker    Packs/day: 1.00    Years: 21.00    Pack years: 21.00  . Smokeless tobacco: Never Used  Substance and Sexual Activity  . Alcohol use: Yes    Comment: rare  . Drug use: No  . Sexual activity: Not on file

## 2018-05-03 IMAGING — US US ABDOMEN COMPLETE
1 series · 14 of 25 positions shown · non-contrast
Comparison: No prior.

CLINICAL DATA: Epigastric pain.

EXAM:
ABDOMEN ULTRASOUND COMPLETE

[Series 1: us abdomen complete · 0.43mm/px · 14 of 80 slices shown]
[im 1/80]
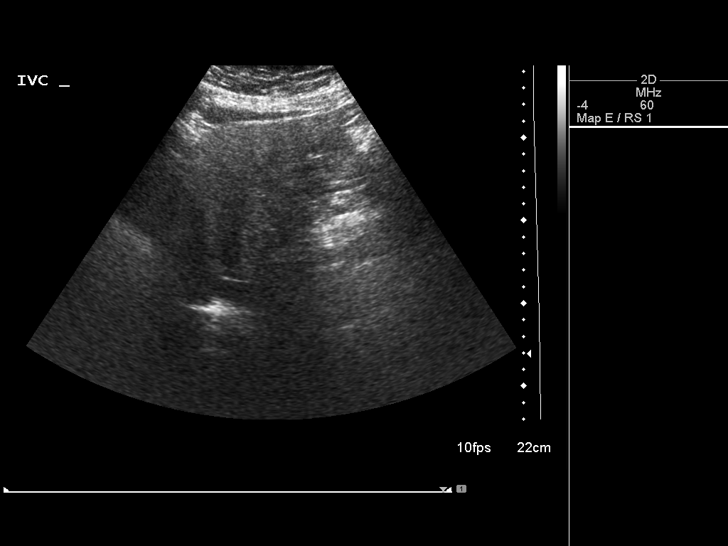
[im 7/80]
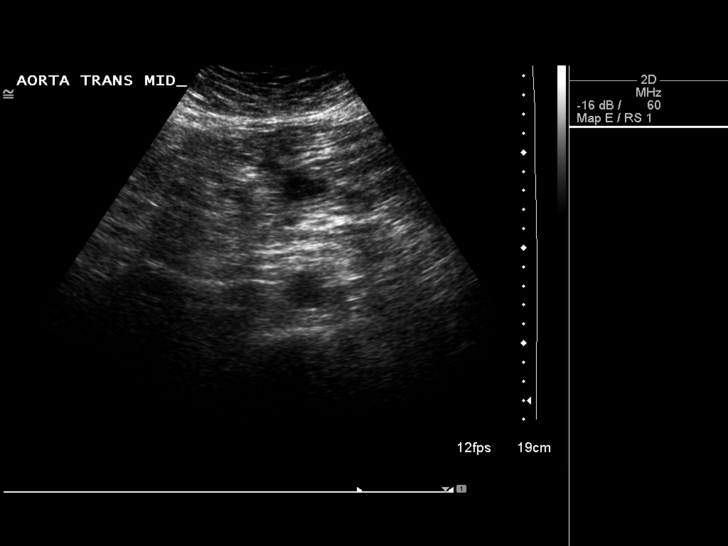
[im 14/80]
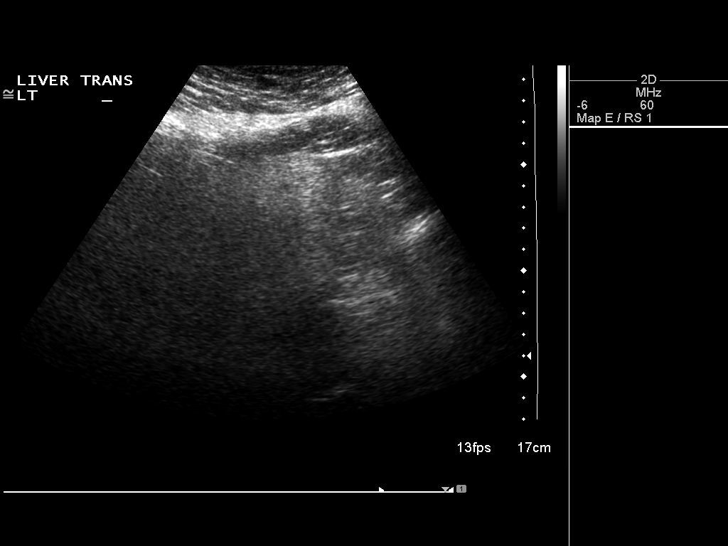
[im 20/80]
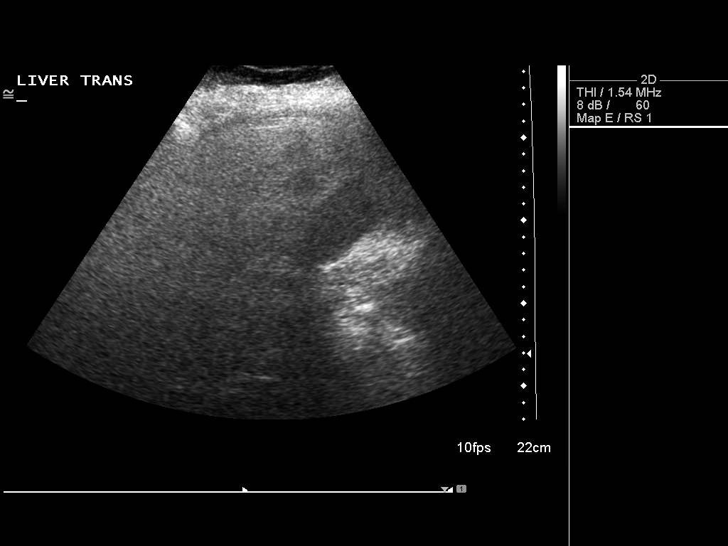
[im 27/80]
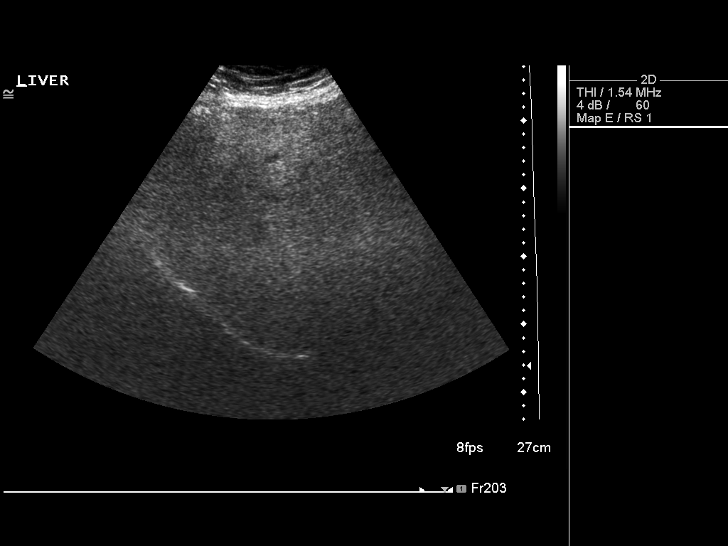
[im 30/80]
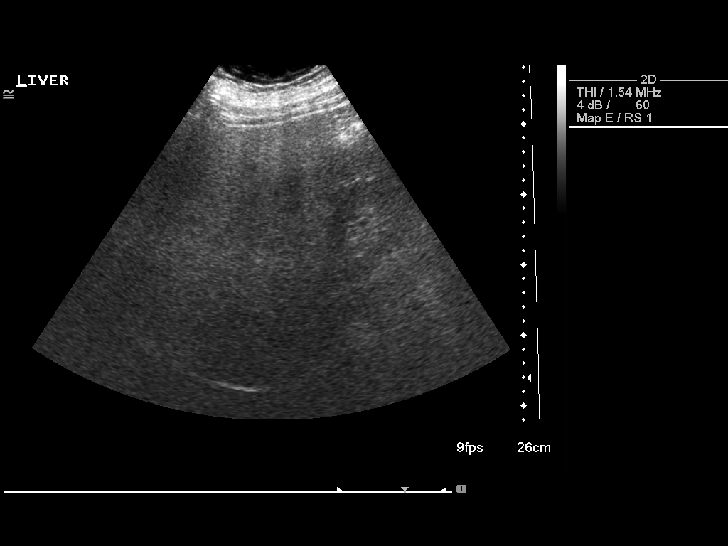
[im 37/80]
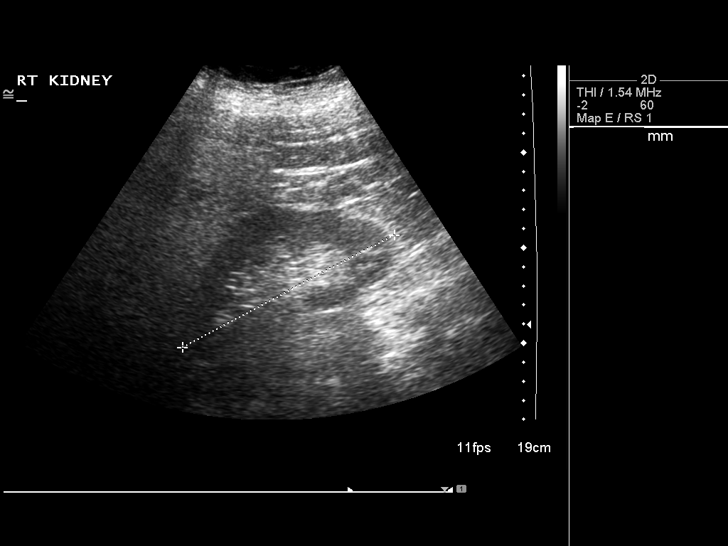
[im 43/80]
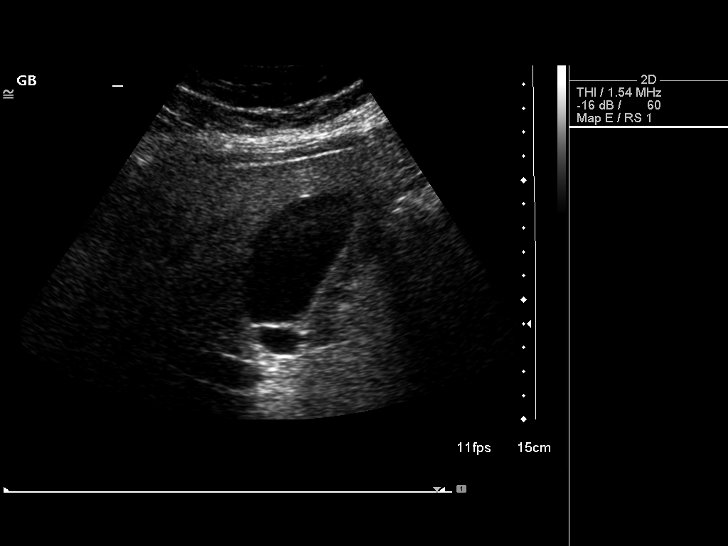
[im 50/80]
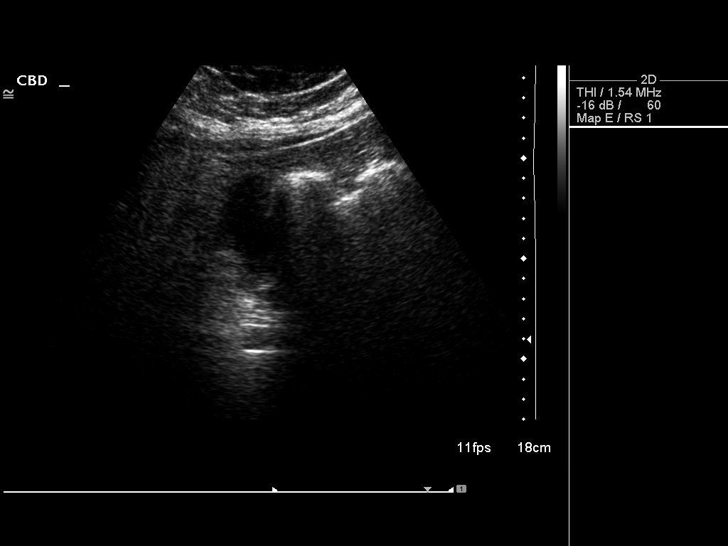
[im 53/80]
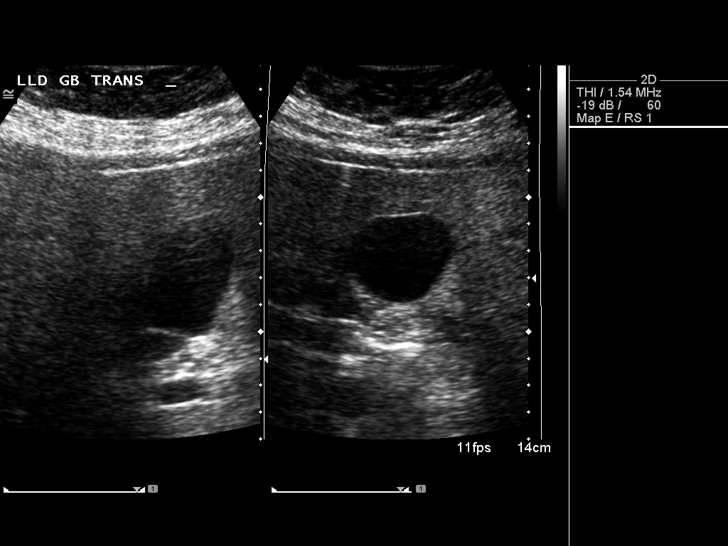
[im 60/80]
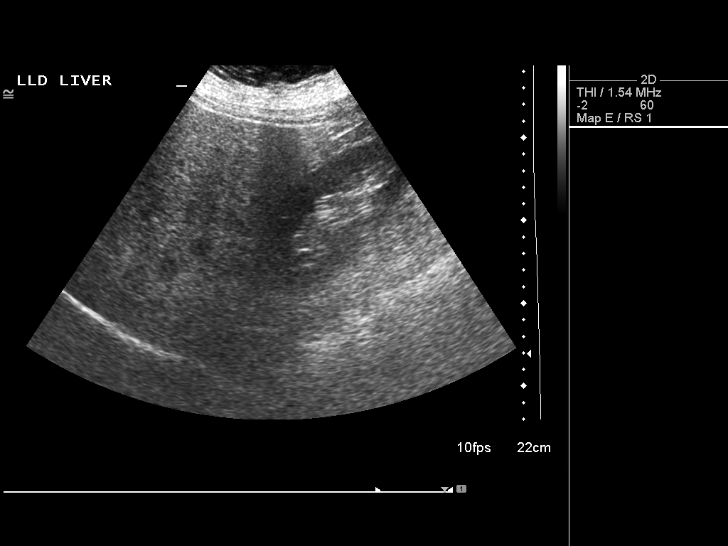
[im 66/80]
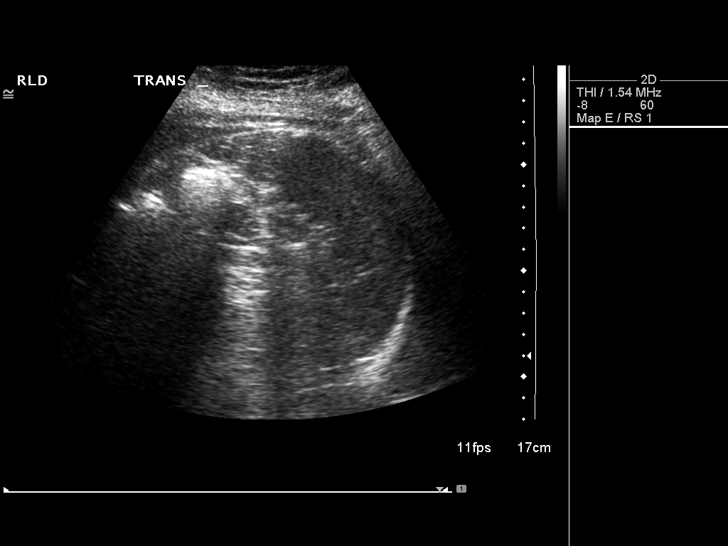
[im 73/80]
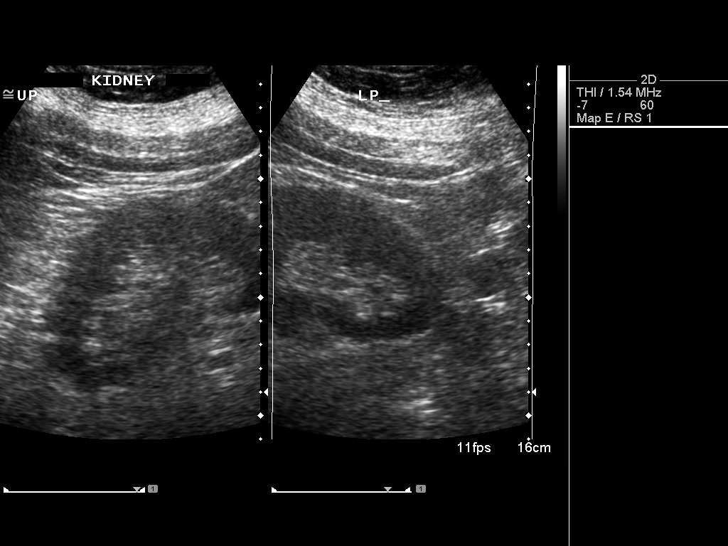
[im 80/80]
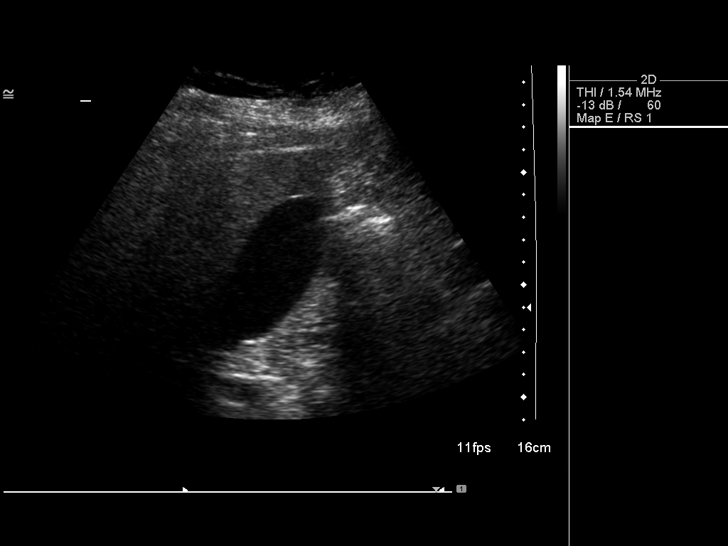

[14 of 25 positions shown; findings below may reference images not displayed]

FINDINGS: Gallbladder: No gallstones or wall thickening visualized. No
sonographic Murphy sign noted by sonographer.

Common bile duct: Diameter: 3.6 mm

Liver: The liver is echodense consistent fatty infiltration and/or
hepatocellular disease. No focal hepatic abnormality identified.

IVC: No abnormality visualized.

Pancreas: Visualized portion unremarkable.

Spleen: Size and appearance within normal limits.

Right Kidney: Length: 12.9 cm. Echogenicity within normal limits. No
mass or hydronephrosis visualized.

Left Kidney: Length: 12.4 cm. Echogenicity within normal limits. No
mass or hydronephrosis visualized.

Abdominal aorta: No aneurysm visualized.

Other findings: None.
IMPRESSION: 1. Liver is echodense consistent with fatty infiltration and/or
hepatocellular disease. No focal hepatic abnormality identified.

2. No acute abnormality.  No gallstones or biliary distention.

## 2020-03-25 ENCOUNTER — Telehealth: Payer: Self-pay | Admitting: Orthopedic Surgery

## 2020-03-25 NOTE — Telephone Encounter (Signed)
Patient's medical records request sent to [facility] 03/25/20 

## 2021-02-15 ENCOUNTER — Ambulatory Visit: Payer: Self-pay | Admitting: Surgery

## 2021-03-02 ENCOUNTER — Encounter (HOSPITAL_BASED_OUTPATIENT_CLINIC_OR_DEPARTMENT_OTHER): Payer: Self-pay | Admitting: Surgery

## 2021-03-02 ENCOUNTER — Other Ambulatory Visit: Payer: Self-pay

## 2021-03-08 ENCOUNTER — Encounter (HOSPITAL_BASED_OUTPATIENT_CLINIC_OR_DEPARTMENT_OTHER)
Admission: RE | Admit: 2021-03-08 | Discharge: 2021-03-08 | Disposition: A | Discharge status: Home / Self Care | Payer: Medicare Other | Source: Ambulatory Visit | Attending: Surgery | Admitting: Surgery

## 2021-03-08 DIAGNOSIS — Z01810 Encounter for preprocedural cardiovascular examination: Secondary | ICD-10-CM | POA: Diagnosis present

## 2021-03-08 NOTE — Progress Notes (Signed)

## 2021-03-09 DIAGNOSIS — M1990 Unspecified osteoarthritis, unspecified site: Secondary | ICD-10-CM | POA: Diagnosis not present

## 2021-03-09 DIAGNOSIS — K219 Gastro-esophageal reflux disease without esophagitis: Secondary | ICD-10-CM | POA: Diagnosis not present

## 2021-03-09 DIAGNOSIS — G4730 Sleep apnea, unspecified: Secondary | ICD-10-CM | POA: Diagnosis not present

## 2021-03-09 DIAGNOSIS — J449 Chronic obstructive pulmonary disease, unspecified: Secondary | ICD-10-CM | POA: Diagnosis not present

## 2021-03-09 DIAGNOSIS — Z7984 Long term (current) use of oral hypoglycemic drugs: Secondary | ICD-10-CM | POA: Diagnosis not present

## 2021-03-09 DIAGNOSIS — G8929 Other chronic pain: Secondary | ICD-10-CM | POA: Diagnosis not present

## 2021-03-09 DIAGNOSIS — L732 Hidradenitis suppurativa: Secondary | ICD-10-CM | POA: Diagnosis not present

## 2021-03-09 DIAGNOSIS — I10 Essential (primary) hypertension: Secondary | ICD-10-CM | POA: Diagnosis not present

## 2021-03-09 DIAGNOSIS — E119 Type 2 diabetes mellitus without complications: Secondary | ICD-10-CM | POA: Diagnosis not present

## 2021-03-09 DIAGNOSIS — F17210 Nicotine dependence, cigarettes, uncomplicated: Secondary | ICD-10-CM | POA: Diagnosis not present

## 2021-03-09 DIAGNOSIS — Z6841 Body Mass Index (BMI) 40.0 and over, adult: Secondary | ICD-10-CM | POA: Diagnosis not present

## 2021-03-09 DIAGNOSIS — M549 Dorsalgia, unspecified: Secondary | ICD-10-CM | POA: Diagnosis not present

## 2021-03-09 LAB — BASIC METABOLIC PANEL
Anion gap: 10 (ref 5–15)
BUN: 8 mg/dL (ref 6–20)
CO2: 25 mmol/L (ref 22–32)
Calcium: 8.6 mg/dL — ABNORMAL LOW (ref 8.9–10.3)
Chloride: 99 mmol/L (ref 98–111)
Creatinine, Ser: 0.91 mg/dL (ref 0.61–1.24)
GFR, Estimated: >60 mL/min (ref >60)
Glucose, Bld: 131 mg/dL — ABNORMAL HIGH (ref 70–99)
Potassium: 6.6 mmol/L (ref 3.5–5.1)
Sodium: 134 mmol/L — ABNORMAL LOW (ref 135–145)

## 2021-03-09 NOTE — Progress Notes (Signed)
K+ 6.6, reviewed with Dr. Salvadore Farber, will repeat BMET day of surgery.

## 2021-03-10 ENCOUNTER — Encounter (HOSPITAL_BASED_OUTPATIENT_CLINIC_OR_DEPARTMENT_OTHER)
Admission: RE | Disposition: A | Discharge status: Home / Self Care | Payer: Self-pay | Source: Home / Self Care | Attending: Surgery

## 2021-03-10 ENCOUNTER — Other Ambulatory Visit: Payer: Self-pay

## 2021-03-10 ENCOUNTER — Ambulatory Visit (HOSPITAL_BASED_OUTPATIENT_CLINIC_OR_DEPARTMENT_OTHER): Payer: Medicare Other | Admitting: Anesthesiology

## 2021-03-10 ENCOUNTER — Encounter (HOSPITAL_BASED_OUTPATIENT_CLINIC_OR_DEPARTMENT_OTHER): Payer: Self-pay | Admitting: Surgery

## 2021-03-10 ENCOUNTER — Ambulatory Visit (HOSPITAL_BASED_OUTPATIENT_CLINIC_OR_DEPARTMENT_OTHER)
Admission: RE | Admit: 2021-03-10 | Discharge: 2021-03-10 | Disposition: A | Discharge status: Home / Self Care | Payer: Medicare Other | Attending: Surgery | Admitting: Surgery

## 2021-03-10 DIAGNOSIS — G8929 Other chronic pain: Secondary | ICD-10-CM | POA: Insufficient documentation

## 2021-03-10 DIAGNOSIS — M1990 Unspecified osteoarthritis, unspecified site: Secondary | ICD-10-CM | POA: Insufficient documentation

## 2021-03-10 DIAGNOSIS — J449 Chronic obstructive pulmonary disease, unspecified: Secondary | ICD-10-CM | POA: Insufficient documentation

## 2021-03-10 DIAGNOSIS — L732 Hidradenitis suppurativa: Secondary | ICD-10-CM | POA: Insufficient documentation

## 2021-03-10 DIAGNOSIS — K219 Gastro-esophageal reflux disease without esophagitis: Secondary | ICD-10-CM | POA: Insufficient documentation

## 2021-03-10 DIAGNOSIS — Z7984 Long term (current) use of oral hypoglycemic drugs: Secondary | ICD-10-CM | POA: Diagnosis not present

## 2021-03-10 DIAGNOSIS — Z6841 Body Mass Index (BMI) 40.0 and over, adult: Secondary | ICD-10-CM | POA: Insufficient documentation

## 2021-03-10 DIAGNOSIS — E119 Type 2 diabetes mellitus without complications: Secondary | ICD-10-CM | POA: Diagnosis not present

## 2021-03-10 DIAGNOSIS — I10 Essential (primary) hypertension: Secondary | ICD-10-CM | POA: Insufficient documentation

## 2021-03-10 DIAGNOSIS — F17210 Nicotine dependence, cigarettes, uncomplicated: Secondary | ICD-10-CM | POA: Insufficient documentation

## 2021-03-10 DIAGNOSIS — M549 Dorsalgia, unspecified: Secondary | ICD-10-CM | POA: Insufficient documentation

## 2021-03-10 DIAGNOSIS — G4730 Sleep apnea, unspecified: Secondary | ICD-10-CM | POA: Insufficient documentation

## 2021-03-10 HISTORY — DX: Depression, unspecified: F32.A

## 2021-03-10 HISTORY — DX: Anxiety disorder, unspecified: F41.9

## 2021-03-10 HISTORY — PX: HYDRADENITIS EXCISION: SHX5243

## 2021-03-10 LAB — BASIC METABOLIC PANEL
Anion gap: 11 (ref 5–15)
BUN: 9 mg/dL (ref 6–20)
CO2: 23 mmol/L (ref 22–32)
Calcium: 8.4 mg/dL — ABNORMAL LOW (ref 8.9–10.3)
Chloride: 100 mmol/L (ref 98–111)
Creatinine, Ser: 0.95 mg/dL (ref 0.61–1.24)
GFR, Estimated: >60 mL/min (ref >60)
Glucose, Bld: 184 mg/dL — ABNORMAL HIGH (ref 70–99)
Potassium: 3.8 mmol/L (ref 3.5–5.1)
Sodium: 134 mmol/L — ABNORMAL LOW (ref 135–145)

## 2021-03-10 LAB — GLUCOSE, CAPILLARY
Glucose-Capillary: 185 mg/dL — ABNORMAL HIGH (ref 70–99)
Glucose-Capillary: 157 mg/dL — ABNORMAL HIGH (ref 70–99)

## 2021-03-10 SURGERY — EXCISION, HIDRADENITIS, AXILLA
Anesthesia: General | Site: Groin | Laterality: Bilateral

## 2021-03-10 MED ORDER — CELECOXIB 200 MG PO CAPS
ORAL_CAPSULE | ORAL | Status: AC
  Filled 2021-03-10: qty 1

## 2021-03-10 MED ORDER — BUPIVACAINE-EPINEPHRINE 0.25% -1:200000 IJ SOLN
INTRAMUSCULAR | Status: DC | PRN
  Administered 2021-03-10: 30 mL

## 2021-03-10 MED ORDER — DEXAMETHASONE SODIUM PHOSPHATE 4 MG/ML IJ SOLN
INTRAMUSCULAR | Status: DC | PRN
  Administered 2021-03-10: 5 mg via INTRAVENOUS

## 2021-03-10 MED ORDER — CELECOXIB 200 MG PO CAPS
200.0000 mg | ORAL_CAPSULE | ORAL | Status: AC
  Administered 2021-03-10: 200 mg via ORAL

## 2021-03-10 MED ORDER — ACETAMINOPHEN 500 MG PO TABS
ORAL_TABLET | ORAL | Status: AC
  Filled 2021-03-10: qty 2

## 2021-03-10 MED ORDER — CHLORHEXIDINE GLUCONATE CLOTH 2 % EX PADS: 6.0000 | MEDICATED_PAD | Freq: Once | CUTANEOUS | Status: DC

## 2021-03-10 MED ORDER — FENTANYL CITRATE (PF) 100 MCG/2ML IJ SOLN: INTRAMUSCULAR | Status: DC | PRN

## 2021-03-10 MED ORDER — MIDAZOLAM HCL 2 MG/2ML IJ SOLN: 0.5000 mg | Freq: Once | INTRAMUSCULAR | Status: DC | PRN

## 2021-03-10 MED ORDER — SUGAMMADEX SODIUM 200 MG/2ML IV SOLN
INTRAVENOUS | Status: DC | PRN
  Administered 2021-03-10: 400 mg via INTRAVENOUS

## 2021-03-10 MED ORDER — FENTANYL CITRATE (PF) 100 MCG/2ML IJ SOLN
INTRAMUSCULAR | Status: AC
  Filled 2021-03-10: qty 2

## 2021-03-10 MED ORDER — ROCURONIUM BROMIDE 100 MG/10ML IV SOLN
INTRAVENOUS | Status: DC | PRN
Start: 2021-03-10 — End: 2021-03-10
  Administered 2021-03-10: 60 mg via INTRAVENOUS

## 2021-03-10 MED ORDER — OXYCODONE HCL 5 MG PO TABS: 5.0000 mg | ORAL_TABLET | Freq: Once | ORAL | Status: DC | PRN

## 2021-03-10 MED ORDER — ONDANSETRON HCL 4 MG/2ML IJ SOLN
INTRAMUSCULAR | Status: DC | PRN
  Administered 2021-03-10: 4 mg via INTRAVENOUS

## 2021-03-10 MED ORDER — HYDROMORPHONE HCL 1 MG/ML IJ SOLN: 0.2500 mg | INTRAMUSCULAR | Status: DC | PRN

## 2021-03-10 MED ORDER — MIDAZOLAM HCL 2 MG/2ML IJ SOLN
INTRAMUSCULAR | Status: AC
  Filled 2021-03-10: qty 2

## 2021-03-10 MED ORDER — LIDOCAINE HCL (CARDIAC) PF 100 MG/5ML IV SOSY
PREFILLED_SYRINGE | INTRAVENOUS | Status: DC | PRN
  Administered 2021-03-10: 40 mg via INTRAVENOUS

## 2021-03-10 MED ORDER — PROPOFOL 10 MG/ML IV BOLUS
INTRAVENOUS | Status: AC
  Filled 2021-03-10: qty 20

## 2021-03-10 MED ORDER — MEPERIDINE HCL 25 MG/ML IJ SOLN: 6.2500 mg | INTRAMUSCULAR | Status: DC | PRN

## 2021-03-10 MED ORDER — CLINDAMYCIN PHOSPHATE 900 MG/50ML IV SOLN
INTRAVENOUS | Status: AC
  Filled 2021-03-10: qty 50

## 2021-03-10 MED ORDER — GABAPENTIN 300 MG PO CAPS
300.0000 mg | ORAL_CAPSULE | ORAL | Status: AC
  Administered 2021-03-10: 300 mg via ORAL

## 2021-03-10 MED ORDER — ACETAMINOPHEN 500 MG PO TABS
1000.0000 mg | ORAL_TABLET | ORAL | Status: AC
  Administered 2021-03-10: 1000 mg via ORAL

## 2021-03-10 MED ORDER — EPHEDRINE SULFATE 50 MG/ML IJ SOLN
INTRAMUSCULAR | Status: DC | PRN
  Administered 2021-03-10: 10 mg via INTRAVENOUS

## 2021-03-10 MED ORDER — ONDANSETRON HCL 4 MG/2ML IJ SOLN
INTRAMUSCULAR | Status: AC
  Filled 2021-03-10: qty 2

## 2021-03-10 MED ORDER — BUPIVACAINE-EPINEPHRINE (PF) 0.25% -1:200000 IJ SOLN
INTRAMUSCULAR | Status: AC
  Filled 2021-03-10: qty 30

## 2021-03-10 MED ORDER — FENTANYL CITRATE (PF) 100 MCG/2ML IJ SOLN
INTRAMUSCULAR | Status: DC | PRN
  Administered 2021-03-10: 100 ug via INTRAVENOUS
  Administered 2021-03-10 (×2): 50 ug via INTRAVENOUS

## 2021-03-10 MED ORDER — IBUPROFEN 800 MG PO TABS: 800.0000 mg | ORAL_TABLET | Freq: Three times a day (TID) | ORAL | 0 refills | Status: AC | PRN

## 2021-03-10 MED ORDER — CLINDAMYCIN PHOSPHATE 900 MG/50ML IV SOLN
900.0000 mg | INTRAVENOUS | Status: AC
  Administered 2021-03-10: 900 mg via INTRAVENOUS

## 2021-03-10 MED ORDER — PHENYLEPHRINE HCL (PRESSORS) 10 MG/ML IV SOLN
INTRAVENOUS | Status: DC | PRN
  Administered 2021-03-10 (×5): 80 ug via INTRAVENOUS

## 2021-03-10 MED ORDER — PROMETHAZINE HCL 25 MG/ML IJ SOLN: 6.2500 mg | INTRAMUSCULAR | Status: DC | PRN

## 2021-03-10 MED ORDER — GABAPENTIN 300 MG PO CAPS
ORAL_CAPSULE | ORAL | Status: AC
  Filled 2021-03-10: qty 1

## 2021-03-10 MED ORDER — LACTATED RINGERS IV SOLN: INTRAVENOUS | Status: DC

## 2021-03-10 MED ORDER — MIDAZOLAM HCL 5 MG/5ML IJ SOLN
INTRAMUSCULAR | Status: DC | PRN
  Administered 2021-03-10: 2 mg via INTRAVENOUS

## 2021-03-10 MED ORDER — OXYCODONE HCL 5 MG/5ML PO SOLN: 5.0000 mg | Freq: Once | ORAL | Status: DC | PRN

## 2021-03-10 MED ORDER — OXYCODONE HCL 5 MG PO TABS: 5.0000 mg | ORAL_TABLET | Freq: Four times a day (QID) | ORAL | 0 refills | Status: AC | PRN

## 2021-03-10 MED ORDER — PROPOFOL 10 MG/ML IV BOLUS
INTRAVENOUS | Status: DC | PRN
  Administered 2021-03-10: 200 mg via INTRAVENOUS

## 2021-03-10 SURGICAL SUPPLY — 33 items
ADH SKN CLS APL DERMABOND .7 (GAUZE/BANDAGES/DRESSINGS) ×2
BLADE SURG 15 STRL LF DISP TIS (BLADE) ×1 IMPLANT
BLADE SURG 15 STRL SS (BLADE) ×2
COVER BACK TABLE 60X90IN (DRAPES) ×2 IMPLANT
COVER MAYO STAND STRL (DRAPES) ×2 IMPLANT
DERMABOND ADVANCED (GAUZE/BANDAGES/DRESSINGS) ×2
DERMABOND ADVANCED .7 DNX12 (GAUZE/BANDAGES/DRESSINGS) ×2 IMPLANT
DRAPE UTILITY XL STRL (DRAPES) ×2 IMPLANT
ELECT COATED BLADE 2.86 ST (ELECTRODE) ×2 IMPLANT
ELECT REM PT RETURN 9FT ADLT (ELECTROSURGICAL) ×2
ELECTRODE REM PT RTRN 9FT ADLT (ELECTROSURGICAL) ×1 IMPLANT
GLOVE SRG 8 PF TXTR STRL LF DI (GLOVE) ×1 IMPLANT
GLOVE SURG LTX SZ8 (GLOVE) ×2 IMPLANT
GLOVE SURG POLYISO LF SZ6.5 (GLOVE) ×2 IMPLANT
GLOVE SURG UNDER POLY LF SZ6.5 (GLOVE) ×2 IMPLANT
GLOVE SURG UNDER POLY LF SZ8 (GLOVE) ×2
GOWN STRL REUS W/ TWL LRG LVL3 (GOWN DISPOSABLE) ×1 IMPLANT
GOWN STRL REUS W/ TWL XL LVL3 (GOWN DISPOSABLE) ×2 IMPLANT
GOWN STRL REUS W/TWL LRG LVL3 (GOWN DISPOSABLE) ×2
GOWN STRL REUS W/TWL XL LVL3 (GOWN DISPOSABLE) ×4
NEEDLE HYPO 25X1 1.5 SAFETY (NEEDLE) ×4 IMPLANT
PACK BASIN DAY SURGERY FS (CUSTOM PROCEDURE TRAY) ×2 IMPLANT
PACK LITHOTOMY IV (CUSTOM PROCEDURE TRAY) ×2 IMPLANT
PENCIL SMOKE EVACUATOR (MISCELLANEOUS) ×2 IMPLANT
SLEEVE SCD COMPRESS KNEE MED (STOCKING) ×2 IMPLANT
SPONGE T-LAP 4X18 ~~LOC~~+RFID (SPONGE) ×2 IMPLANT
SUT MNCRL AB 3-0 PS2 18 (SUTURE) ×4 IMPLANT
SUT VICRYL 3-0 CR8 SH (SUTURE) ×6 IMPLANT
SYR CONTROL 10ML LL (SYRINGE) ×4 IMPLANT
TOWEL GREEN STERILE FF (TOWEL DISPOSABLE) ×4 IMPLANT
TRAY DSU PREP LF (CUSTOM PROCEDURE TRAY) ×2 IMPLANT
TUBE CONNECTING 20X1/4 (TUBING) ×2 IMPLANT
YANKAUER SUCT BULB TIP NO VENT (SUCTIONS) ×2 IMPLANT

## 2021-03-10 NOTE — Op Note (Addendum)
Preoperative diagnosis: Bilateral groin hidradenitis  Postoperative diagnosis: Same  Procedure: Excision of bilateral groin hidradenitis  Left groin area measured 5 x 7 cm and a second left groin area measured 10 x 15 cm of skin and subcutaneous tissue consisting of multiple sinus tracts and small areas of hidradenitis which was not active right groin had an area of 10 x 12 cm of hidradenitis which was an active consisting of skin and subcutaneous tissue  Surgeon: Erroll Luna, MD  Anesthesia: General with 0.25% Marcaine with epinephrine local  EBL: 70 cc  Specimen: Please see above  Drains: None  IV fluids: Per anesthesia record  Indications for procedure: The patient is a 55 year old male with hidradenitis of his groins.  He is tried medical management and bouts of antibiotics and conservative care.  These areas have not resolved and he presents for surgical excision.  We discussed risk of bleeding, infection cosmetic deformity, poor wound healing due to his medical problem as well as diabetes, recurrence, wound breakdown, the need for open wound care, injury to blood vessels, injury to nerves, injury to other neighboring structures in this area and the need further treatments and/or procedures.  We discussed further nonoperative management but he had no interest in doing that.  He agreed to proceed.  Description of procedure: The patient was met in the holding area and questions were answered.  Both areas in both groins were marked.  He was then taken back to the operating.  He is placed supine upon the OR table.  After induction general esthesia was placed in lithotomy in stirrups and was appropriately padded.  There is no undue tension on his knees hips or any undue tension on surface area.  We then were able to prepped and draped the perineum.  Timeout was performed.  He had an area in his left inguinal crease measuring 5 x 7 cm.  This was the initial area were excised.  Local  anesthetic was infiltrated and an elliptical incision was made around the area of hidradenitis.  This was taken to the subcu fat and all skin was removed in this area.  Local anesthetic infiltrated.  Hemostasis achieved with cautery.  We then closed wound to the deep layer of 3-0 Vicryl.  The left perineum was then addressed.  This area measured 10 x 15 cm of hidradenitis.  An elliptical incision was made around this after infiltration local anesthetic.  All the skin in this area is excised down into the subcutaneous fat.  This passed off the field.  Wound was made hemostatic with cautery and then closed with a deep layer 3-0 Vicryl and subsequent 3 o Monocryl.  In a similar fashion the area in the right groin was excised.  It was in the perineum.  Elliptical incisions were made around it.  All the skin and hidradenitis disease was excised.  Is taken down to healthy fat.  There is no significant bleeding after use of cautery.  We then closed it with 3-0 Vicryl and 3-0 Monocryl Dermabond applied to all 3 incisions.  Mesh panties placed.  All counts were found to be correct.  He was carefully taken out of lithotomy.  At that point he was extubated and taken recovery in satisfactory condition.  All final counts were found to be correct.

## 2021-03-10 NOTE — Anesthesia Preprocedure Evaluation (Addendum)
Anesthesia Evaluation  Patient identified by MRN, date of birth, ID band Patient awake    Reviewed: Allergy & Precautions, NPO status , Patient's Chart, lab work & pertinent test results, reviewed documented beta blocker date and time   History of Anesthesia Complications Negative for: history of anesthetic complications  Airway Mallampati: I  TM Distance: >3 FB Neck ROM: Full    Dental  (+) Edentulous Upper, Edentulous Lower   Pulmonary sleep apnea (does not use CPAP, had UPPP) , COPD, Current SmokerPatient did not abstain from smoking.,    breath sounds clear to auscultation       Cardiovascular hypertension, Pt. on medications and Pt. on home beta blockers (-) angina Rhythm:Regular Rate:Normal     Neuro/Psych Chronic back pain: spinal cord stimulator    GI/Hepatic Neg liver ROS, GERD  Controlled,  Endo/Other  diabetes, Oral Hypoglycemic AgentsHypothyroidism Morbid obesity  Renal/GU negative Renal ROS     Musculoskeletal  (+) Arthritis ,   Abdominal (+) + obese,   Peds  Hematology negative hematology ROS (+)   Anesthesia Other Findings   Reproductive/Obstetrics                            Anesthesia Physical Anesthesia Plan  ASA: 3  Anesthesia Plan: General   Post-op Pain Management: Tylenol PO (pre-op) and Dilaudid IV   Induction:   PONV Risk Score and Plan: 1 and Ondansetron and Dexamethasone  Airway Management Planned: Oral ETT  Additional Equipment: None  Intra-op Plan:   Post-operative Plan: Extubation in OR  Informed Consent: I have reviewed the patients History and Physical, chart, labs and discussed the procedure including the risks, benefits and alternatives for the proposed anesthesia with the patient or authorized representative who has indicated his/her understanding and acceptance.       Plan Discussed with: CRNA and Surgeon  Anesthesia Plan Comments:         Anesthesia Quick Evaluation

## 2021-03-10 NOTE — Discharge Instructions (Addendum)
#######################################################  GENERAL SURGERY: POST OP INSTRUCTIONS  ######################################################################  EAT Gradually transition to a high fiber diet with a fiber supplement over the next few weeks after discharge.  Start with a pureed / full liquid diet (see below)  WALK Walk an hour a day.  Control your pain to do that.    CONTROL PAIN Control pain so that you can walk, sleep, tolerate sneezing/coughing, go up/down stairs.  HAVE A BOWEL MOVEMENT DAILY Keep your bowels regular to avoid problems.  OK to try a laxative to override constipation.  OK to use an antidairrheal to slow down diarrhea.  Call if not better after 2 tries  CALL IF YOU HAVE PROBLEMS/CONCERNS Call if you are still struggling despite following these instructions. Call if you have concerns not answered by these instructions  ######################################################################    DIET: Follow a light bland diet & liquids the first 24 hours after arrival home, such as soup, liquids, starches, etc.  Be sure to drink plenty of fluids.  Quickly advance to a usual solid diet within a few days.  Avoid fast food or heavy meals as your are more likely to get nauseated or have irregular bowels.  A low-fat, high-fiber diet for the rest of your life is ideal.    Take your usually prescribed home medications unless otherwise directed.  PAIN CONTROL: Pain is best controlled by a usual combination of three different methods TOGETHER: Ice/Heat Over the counter pain medication Prescription pain medication Most patients will experience some swelling and bruising around the incisions.  Ice packs or heating pads (30-60 minutes up to 6 times a day) will help. Use ice for the first few days to help decrease swelling and bruising, then switch to heat to help relax tight/sore spots and speed recovery.  Some people prefer to use ice alone, heat alone,  alternating between ice & heat.  Experiment to what works for you.  Swelling and bruising can take several weeks to resolve.   It is helpful to take an over-the-counter pain medication regularly for the first few weeks.  Choose one of the following that works best for you: Naproxen (Aleve, etc)  Two 220mg tabs twice a day Ibuprofen (Advil, etc) Three 200mg tabs four times a day (every meal & bedtime) Acetaminophen (Tylenol, etc) 500-650mg four times a day (every meal & bedtime) A  prescription for pain medication (such as oxycodone, hydrocodone, etc) should be given to you upon discharge.  Take your pain medication as prescribed.  If you are having problems/concerns with the prescription medicine (does not control pain, nausea, vomiting, rash, itching, etc), please call us [phone] to see if we need to switch you to a different pain medicine that will work better for you and/or control your side effect better. If you need a refill on your pain medication, please contact your pharmacy.  They will contact our office to request authorization. Prescriptions will not be filled after 5 pm or on week-ends.  Avoid getting constipated.  Between the surgery and the pain medications, it is common to experience some constipation.  Increasing fluid intake and taking a fiber supplement (such as Metamucil, Citrucel, FiberCon, MiraLax, etc) 1-2 times a day regularly will usually help prevent this problem from occurring.  A mild laxative (prune juice, Milk of Magnesia, MiraLax, etc) should be taken according to package directions if there are no bowel movements after 48 hours.   Watch out for diarrhea.  If you have many loose bowel movements, simplify your   diet to bland foods & liquids for a few days.  Stop any stool softeners and decrease your fiber supplement.  Switching to mild anti-diarrheal medications (Loperamide/Imodium, Kayopectate, Pepto Bismol) can help.  If this worsens or does not improve, please call  us.  Wash / shower every day.  You may shower over the dressings as they are waterproof.  Continue to shower over incision(s) after the dressing is off. Remove your waterproof bandages 5 days after surgery.  You may leave the incision open to air.  You may have skin tapes (Steri Strips) covering the incision(s).  Leave them on until one week, then remove.  You may replace a dressing/Band-Aid to cover the incision for comfort if you wish.   ACTIVITIES as tolerated:   You may resume regular (light) daily activities beginning the next day--such as daily self-care, walking, climbing stairs--gradually increasing activities as tolerated.  If you can walk 30 minutes without difficulty, it is safe to try more intense activity such as jogging, treadmill, bicycling, low-impact aerobics, swimming, etc. Save the most intensive and strenuous activity for last such as sit-ups, heavy lifting, contact sports, etc  Refrain from any heavy lifting or straining until you are off narcotics for pain control.   DO NOT PUSH THROUGH PAIN.  Let pain be your guide: If it hurts to do something, don't do it.  Pain is your body warning you to avoid that activity for another week until the pain goes down. You may drive when you are no longer taking prescription pain medication, you can comfortably wear a seatbelt, and you can safely maneuver your car and apply brakes. You may have sexual intercourse when it is comfortable.   FOLLOW UP in our office Please call [facility] at [phone] to set up an appointment to see your surgeon in the office for a follow-up appointment approximately 2-3 weeks after your surgery. Make sure that you call for this appointment the day you arrive home to insure a convenient appointment time.  9. IF YOU HAVE DISABILITY OR FAMILY LEAVE FORMS, BRING THEM TO THE OFFICE FOR PROCESSING.  DO NOT GIVE THEM TO YOUR DOCTOR.   WHEN TO CALL US [phone]: Poor pain control Reactions / problems with new  medications (rash/itching, nausea, etc)  Fever over 101.5 F (38.5 C) Worsening swelling or bruising Continued bleeding from incision. Increased pain, redness, or drainage from the incision Difficulty breathing / swallowing   The clinic staff is available to answer your questions during regular business hours (8:30am-5pm).  Please don't hesitate to call and ask to speak to one of our nurses for clinical concerns.   If you have a medical emergency, go to the nearest emergency room or call 911.  A surgeon from [facility] is always on call at the hospitals   [facility] [address], [address], [place], [place]  [address] ? MAIN: [phone] ? TOLL FREE: [phone] ?  FAX [phone] [email or web address]  #######################################################    Post Anesthesia Home Care Instructions  Activity: Get plenty of rest for the remainder of the day. A responsible individual must stay with you for 24 hours following the procedure.  For the next 24 hours, DO NOT: -Drive a car -Operate machinery -Drink alcoholic beverages -Take any medication unless instructed by your physician -Make any legal decisions or sign important papers.  Meals: Start with liquid foods such as gelatin or soup. Progress to regular foods as tolerated. Avoid greasy, spicy, heavy foods.   If nausea and/or vomiting occur, drink only clear liquids until the nausea and/or vomiting subsides. Call your physician if vomiting continues.  Special Instructions/Symptoms: Your throat may feel dry or sore from the anesthesia or the breathing tube placed in your throat during surgery. If this causes discomfort, gargle with warm salt water. The discomfort should disappear within 24 hours.  If you had a scopolamine patch placed behind your ear for the management of post- operative nausea and/or vomiting:  1. The medication in the patch is effective for 72 hours,  after which it should be removed.  Wrap patch in a tissue and discard in the trash. Wash hands thoroughly with soap and water. 2. You may remove the patch earlier than 72 hours if you experience unpleasant side effects which may include dry mouth, dizziness or visual disturbances. 3. Avoid touching the patch. Wash your hands with soap and water after contact with the patch.    

## 2021-03-10 NOTE — Interval H&P Note (Signed)
History and Physical Interval Note:  03/10/2021 12:01 PM  Adam [name]  has presented today for surgery, with the diagnosis of hidradenitis.  The various methods of treatment have been discussed with the patient and family. After consideration of risks, benefits and other options for treatment, the patient has consented to  Procedure(s): EXCISION HIDRADENITIS bilateral groin (Bilateral) as a surgical intervention.  The patient's history has been reviewed, patient examined, no change in status, stable for surgery.  I have reviewed the patient's chart and labs.  Questions were answered to the patient's satisfaction.     Marillyn Goren A Tijuana Scheidegger

## 2021-03-10 NOTE — Anesthesia Postprocedure Evaluation (Signed)
Anesthesia Post Note  Patient: Adam [name]  Procedure(s) Performed: EXCISION HIDRADENITIS bilateral groin (Bilateral: Groin)     Patient location during evaluation: PACU Anesthesia Type: General Level of consciousness: awake and alert, patient cooperative and oriented Pain management: pain level controlled Vital Signs Assessment: post-procedure vital signs reviewed and stable Respiratory status: spontaneous breathing, nonlabored ventilation and respiratory function stable Cardiovascular status: blood pressure returned to baseline and stable Postop Assessment: no apparent nausea or vomiting and adequate PO intake Anesthetic complications: no   No notable events documented.  Last Vitals:  Vitals:   03/10/21 1500 03/10/21 1539  BP: 101/66 113/64  Pulse: 72 72  Resp: 17 20  Temp:  37 C  SpO2: 95% 94%    Last Pain:  Vitals:   03/10/21 1539  TempSrc: Oral  PainSc: 2                  [name],[name]

## 2021-03-10 NOTE — Anesthesia Procedure Notes (Signed)
Procedure Name: Intubation Date/Time: 03/10/2021 12:57 PM Performed by: Lavonia Dana, CRNA Pre-anesthesia Checklist: Patient identified, Emergency Drugs available, Suction available and Patient being monitored Patient Re-evaluated:Patient Re-evaluated prior to induction Oxygen Delivery Method: Circle system utilized Preoxygenation: Pre-oxygenation with 100% oxygen Induction Type: IV induction Ventilation: Oral airway inserted - appropriate to patient size and Two handed mask ventilation required Laryngoscope Size: Mac and 4 Grade View: Grade II Tube type: Oral Tube size: 7.5 mm Number of attempts: 1 Airway Equipment and Method: Stylet and Oral airway Placement Confirmation: ETT inserted through vocal cords under direct vision, positive ETCO2 and breath sounds checked- equal and bilateral Secured at: 23 cm Tube secured with: Tape Dental Injury: Teeth and Oropharynx as per pre-operative assessment

## 2021-03-10 NOTE — Transfer of Care (Signed)
Immediate Anesthesia Transfer of Care Note  Patient: Adam [name]  Procedure(s) Performed: EXCISION HIDRADENITIS bilateral groin (Bilateral: Groin)  Patient Location: PACU  Anesthesia Type:General  Level of Consciousness: drowsy  Airway & Oxygen Therapy: Patient Spontanous Breathing and Patient connected to face mask oxygen  Post-op Assessment: Report given to RN and Post -op Vital signs reviewed and stable  Post vital signs: Reviewed and stable  Last Vitals:  Vitals Value Taken Time  BP    Temp    Pulse 79 03/10/21 1440  Resp 23 03/10/21 1440  SpO2 84 % 03/10/21 1440  Vitals shown include unvalidated device data.  Last Pain:  Vitals:   03/10/21 0950  TempSrc: Oral  PainSc: 8       Patients Stated Pain Goal: 3 (03/10/21 0950)  Complications: No notable events documented.

## 2021-03-10 NOTE — H&P (Signed)
History of Present Illness: Adam [name] is a 55 y.o. male who is seen today as an office consultation at the request of Dr. Delena Serve for evaluation of Cyst .   Patient has 2 chronic draining areas in both groins. These are actually in the inner thighs at the junction of the pelvis. There are they swell up the drain to get better but this process is pink going on for 6 months. He was trialed on a new diabetic drug which seem to trigger this. He has no fever or chills. He has many chronic medical problems and is followed by pain clinic due to multiple chronic orthopedic injuries. He is compliant with those doctors and medications.  Review of Systems: A complete review of systems was obtained from the patient. I have reviewed this information and discussed as appropriate with the patient. See HPI as well for other ROS.    Medical History: Past Medical History:  Diagnosis Date   Arthritis   DVT (deep venous thrombosis) (CMS-HCC)   GERD (gastroesophageal reflux disease)   Sleep apnea   Thyroid disease   There is no problem list on file for this patient.  History reviewed. No pertinent surgical history.   Allergies  Allergen Reactions   Amoxicillin-Pot Clavulanate Nausea And Vomiting and Other (See Comments)  "Possible reaction" "Possible reaction"   Erythropoietin Analogues Other (See Comments)   Current Outpatient Medications on File Prior to Visit  Medication Sig Dispense Refill   liraglutide (VICTOZA 2-PAK) 0.6 mg/0.1 mL (18 mg/3 mL) pen injector Inject subcutaneously   acetaminophen-codeine (TYLENOL W/ CODEINE) 120-12 mg/5 mL oral solution Take by mouth at bedtime as needed   aspirin 81 MG chewable tablet 1 tablet (81 mg total)   glipiZIDE (GLUCOTROL XL) 10 MG XL tablet 1 tablet with food   hydrOXYzine HCL (ATARAX) 10 MG tablet Take by mouth   levothyroxine (SYNTHROID) 112 MCG tablet Take by mouth   lisinopriL-hydrochlorothiazide (ZESTORETIC) 20-25 mg tablet 1 tablet    metFORMIN (GLUCOPHAGE) 500 MG tablet 2 tablets with meals   [START ON 02/18/2021] oxyCODONE (ROXICODONE) 30 MG immediate release tablet 1 tablet (30 mg total)   oxyCODONE myristate (XTAMPZA ER) 18 mg ER capsule 1 capsule with food   pravastatin (PRAVACHOL) 40 MG tablet 1 tablet (40 mg total)   zolpidem (AMBIEN CR) 12.5 MG CR tablet Take by mouth   No current facility-administered medications on file prior to visit.   Family History  Problem Relation Age of Onset   Hyperlipidemia (Elevated cholesterol) Mother   Obesity Mother   High blood pressure (Hypertension) Mother   Deep vein thrombosis (DVT or abnormal blood clot formation) Mother   Colon cancer Mother    Social History   Tobacco Use  Smoking Status Every Day   Types: Cigarettes  Smokeless Tobacco Never    Social History   Socioeconomic History   Marital status: Single  Tobacco Use   Smoking status: Every Day  Types: Cigarettes   Smokeless tobacco: Never  Substance and Sexual Activity   Alcohol use: Never   Drug use: Never   Objective:   Vitals:  02/15/21 0949  BP: (!) 140/76  Pulse: 96  Temp: (!) 34.9 C (94.9 F)  SpO2: 92%  Weight: (!) 162.4 kg (358 lb)  Height: 190.5 cm (6\' 3" )   Body mass index is 44.75 kg/m.  Physical Exam Constitutional:  Appearance: Normal appearance.  Pulmonary:  Effort: Pulmonary effort is normal.  Breath sounds: No stridor.  Abdominal:  Comments:  morbidly obese  Genitourinary:  Musculoskeletal:  Cervical back: Normal range of motion and neck supple.  Comments: Multiple lower extremity scars from trauma. 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  Mental Status: He is alert.     Assessment and Plan:  Diagnoses and all orders for this visit:  Hidradenitis    Areas noted in the groin regions bilaterally down to the perineum at  the junction of the thigh bilaterally this appears to be excessive skin as well with skin tags associated. These areas also have draining sinus tracts. Recommend wide excision of both areas as an outpatient. This will need to be done in the lithotomy position. Discussed risks and benefits of surgery as well as nonsurgical options. He does have diabetes and smokes which increases wound complication rate significantly. And I counseled him on smoking cessation since this will increase his complication rate and slow his recovery. It would also increase the risk of this failing. That was explained very clearly to him today. He will need to have good control of his diabetes. He may have recurrences as well. I do not think he is a good candidate for any sort of TNF immunotherapy given his diabetes and other medical problems. The areas are causing quite a bit of friction and get inflamed and excision I think would be his best treatment at this point in time. Discussed risk of bleeding, infection, recurrent surgery, injury to nearby structures, cardiovascular complications, pulmonary complications, smoking cessation and complications from smoking, diabetic complications and complications from diabetes. He understands wished to proceed.

## 2021-03-11 ENCOUNTER — Encounter (HOSPITAL_BASED_OUTPATIENT_CLINIC_OR_DEPARTMENT_OTHER): Payer: Self-pay | Admitting: Surgery

## 2021-03-12 LAB — SURGICAL PATHOLOGY

## 2021-04-14 ENCOUNTER — Other Ambulatory Visit (HOSPITAL_BASED_OUTPATIENT_CLINIC_OR_DEPARTMENT_OTHER): Payer: Self-pay

## 2021-04-14 MED ORDER — SEMAGLUTIDE(0.25 OR 0.5MG/DOS) 2 MG/1.5ML ~~LOC~~ SOPN
PEN_INJECTOR | SUBCUTANEOUS | 0 refills | Status: AC
  Filled 2021-04-14 – 2021-04-15 (×2): qty 1.5, 28d supply, fill #0

## 2021-04-15 ENCOUNTER — Other Ambulatory Visit (HOSPITAL_BASED_OUTPATIENT_CLINIC_OR_DEPARTMENT_OTHER): Payer: Self-pay

## 2021-05-17 ENCOUNTER — Other Ambulatory Visit (HOSPITAL_COMMUNITY): Payer: Self-pay

## 2021-05-18 ENCOUNTER — Other Ambulatory Visit (HOSPITAL_COMMUNITY): Payer: Self-pay

## 2021-10-14 DIAGNOSIS — Z0000 Encounter for general adult medical examination without abnormal findings: Secondary | ICD-10-CM | POA: Diagnosis not present

## 2021-10-25 ENCOUNTER — Ambulatory Visit: Payer: Medicare Other | Admitting: Internal Medicine

## 2021-10-25 NOTE — Progress Notes (Deleted)
Name: Adam [name]  MRN/ DOB: [id number], 05/03/1965   Age/ Sex: 56 y.o., male    PCP: Jarrett Soho, PA-C   Reason for Endocrinology Evaluation: Type {NUMBERS 1 OR 2:522190} Diabetes Mellitus     Date of Initial Endocrinology Visit: 10/25/2021     PATIENT IDENTIFIER: Adam [name] is a 56 y.o. male with a past medical history of T2DM, hypothyroid, and HTN. The patient presented for initial endocrinology clinic visit on 10/25/2021 for consultative assistance with his diabetes management.    HPI: Adam [name] was    Diagnosed with DM *** Prior Medications tried/Intolerance: *** Currently checking blood sugars *** x / day,  before breakfast and ***.  Hypoglycemia episodes : ***               Symptoms: ***                 Frequency: ***/  Hemoglobin A1c has ranged from *** in ***, peaking at *** in ***. Patient required assistance for hypoglycemia:  Patient has required hospitalization within the last 1 year from hyper or hypoglycemia:   In terms of diet, the patient ***   HOME DIABETES REGIMEN: Metformin 500 mg Glipizide 10 mg Ozempic 0.5 mg weekly  Statin: Yes ACE-I/ARB: Yes Prior Diabetic Education: {Yes/No:11203}   METER DOWNLOAD SUMMARY: Date range evaluated: *** Fingerstick Blood Glucose Tests = *** Average Number Tests/Day = *** Overall Mean FS Glucose = *** Standard Deviation = ***  BG Ranges: Low = *** High = ***   Hypoglycemic Events/30 Days: BG < 50 = *** Episodes of symptomatic severe hypoglycemia = ***   DIABETIC COMPLICATIONS: Microvascular complications:  *** Denies: CKD Last eye exam: Completed   Macrovascular complications:   Denies: CAD, PVD, CVA   PAST HISTORY: Past Medical History:  Past Medical History:  Diagnosis Date   Anxiety    Arthritis    Chronic pain    Depression    Diabetes mellitus without complication (HCC)    Full dentures    GERD (gastroesophageal reflux disease)    Hyperlipemia    Hypertension     Hypothyroidism    Peripheral neuropathic pain    Pleurisy    as a kid; had lung drained. Scar tissue on the right lung.    Sleep apnea    hx-had corrective surgery   Spinal cord stimulator status    Past Surgical History:  Past Surgical History:  Procedure Laterality Date   ANKLE ARTHROPLASTY  2005   right ankle injury   CHONDROPLASTY Left 04/14/2014   Procedure: CHONDROPLASTY Patella and tibial condyle;  Surgeon: Nestor Lewandowsky, MD;  Location: [facility];  Service: Orthopedics;  Laterality: Left;   HYDRADENITIS EXCISION Bilateral 03/10/2021   Procedure: EXCISION HIDRADENITIS bilateral groin;  Surgeon: Harriette Bouillon, MD;  Location: [facility];  Service: General;  Laterality: Bilateral;   I & D EXTREMITY  2005   right ankle   KNEE ARTHROSCOPY     left x2   KNEE ARTHROSCOPY Left 04/14/2014   Procedure: LEFT KNEE ARTHROSCOPY removal loose body;  Surgeon: Nestor Lewandowsky, MD;  Location: [facility];  Service: Orthopedics;  Laterality: Left;   KNEE ARTHROSCOPY WITH LATERAL MENISECTOMY Left 04/14/2014   Procedure: KNEE ARTHROSCOPY WITH LATERAL MENISECTOMY;  Surgeon: Nestor Lewandowsky, MD;  Location: [facility];  Service: Orthopedics;  Laterality: Left;   KNEE ARTHROSCOPY WITH MEDIAL MENISECTOMY Left 04/14/2014   Procedure: KNEE ARTHROSCOPY WITH  MEDIAL MENISECTOMY;  Surgeon: Nestor Lewandowsky, MD;  Location: [facility];  Service: Orthopedics;  Laterality: Left;   MULTIPLE TOOTH EXTRACTIONS  2015   SPINAL CORD STIMULATOR IMPLANT  2008   SPINAL CORD STIMULATOR IMPLANT  2008   revision   TOTAL KNEE ARTHROPLASTY Left 11/07/2017   TOTAL KNEE ARTHROPLASTY Left 11/07/2017   Procedure: LEFT TOTAL KNEE ARTHROPLASTY;  Surgeon: Cammy Copa, MD;  Location: [facility];  Service: Orthopedics;  Laterality: Left;   UVULOPALATOPHARYNGOPLASTY  2000    Social History:  reports that he has been smoking cigarettes. He has a 21.00 pack-year smoking  history. He has never used smokeless tobacco. He reports current alcohol use. He reports that he does not use drugs. Family History:  Family History  Problem Relation Age of Onset   Clotting disorder Mother      HOME MEDICATIONS: Allergies as of 10/25/2021       Reactions   Augmentin [amoxicillin-pot Clavulanate] Nausea And Vomiting   "Possible reaction"        Medication List        Accurate as of October 25, 2021  7:30 AM. If you have any questions, ask your nurse or doctor.          glipiZIDE 10 MG tablet Commonly known as: GLUCOTROL Take 10 mg by mouth daily before breakfast.   hydrOXYzine 10 MG tablet Commonly known as: ATARAX Take 10 mg by mouth 3 (three) times daily as needed for anxiety.   ibuprofen 800 MG tablet Commonly known as: ADVIL Take 1 tablet (800 mg total) by mouth every 8 (eight) hours as needed.   levothyroxine 112 MCG tablet Commonly known as: SYNTHROID Take 112 mcg by mouth daily before breakfast.   lisinopril-hydrochlorothiazide 20-25 MG tablet Commonly known as: ZESTORETIC Take 1 tablet by mouth daily.   metFORMIN 500 MG tablet Commonly known as: GLUCOPHAGE Take 1,000 mg by mouth 2 (two) times daily with a meal.   oxycodone 30 MG immediate release tablet Commonly known as: ROXICODONE Take 30 mg by mouth 2 (two) times daily as needed.   oxyCODONE 5 MG immediate release tablet Commonly known as: Oxy IR/ROXICODONE Take 1 tablet (5 mg total) by mouth every 6 (six) hours as needed for severe pain.   oxyCODONE ER 27 MG C12a Take 27 mg by mouth in the morning and at bedtime.   Ozempic (0.25 or 0.5 MG/DOSE) 2 MG/1.5ML Sopn Generic drug: Semaglutide(0.25 or 0.5MG /DOS) Inject 0.5 mg into the skin once a week.   Ozempic (0.25 or 0.5 MG/DOSE) 2 MG/1.5ML Sopn Generic drug: Semaglutide(0.25 or 0.5MG /DOS) Inject 0.5 mg subcutaneously once a week   pravastatin 40 MG tablet Commonly known as: PRAVACHOL Take 40 mg by mouth daily.    propranolol 20 MG tablet Commonly known as: INDERAL Take 20 mg by mouth 2 (two) times daily.   sertraline 50 MG tablet Commonly known as: ZOLOFT Take 50 mg by mouth daily.         ALLERGIES: Allergies  Allergen Reactions   Augmentin [Amoxicillin-Pot Clavulanate] Nausea And Vomiting    "Possible reaction"     REVIEW OF SYSTEMS: A comprehensive ROS was conducted with the patient and is negative except as per HPI    OBJECTIVE:   VITAL SIGNS: There were no vitals taken for this visit.   PHYSICAL EXAM:  General: Pt appears well and is in NAD  Neck: General: Supple without adenopathy or carotid bruits. Thyroid: Thyroid size normal.  No goiter or  nodules appreciated. No thyroid bruit.  Lungs: Clear with good BS bilat with no rales, rhonchi, or wheezes  Heart: RRR with normal S1 and S2 and no gallops; no murmurs; no rub  Abdomen: Normoactive bowel sounds, soft, nontender, without masses or organomegaly palpable  Extremities:  Lower extremities - No pretibial edema. No lesions.  Neuro: MS is good with appropriate affect, pt is alert and Ox3    DM foot exam:    DATA REVIEWED:  No results found for: "HGBA1C" Lab Results  Component Value Date   CREATININE 0.95 03/10/2021   No results found for: "MICRALBCREAT"  No results found for: "CHOL", "HDL", "LDLCALC", "LDLDIRECT", "TRIG", "CHOLHDL"      ASSESSMENT / PLAN / RECOMMENDATIONS:   1) Type *** Diabetes Mellitus, ***controlled, With*** complications - Most recent A1c of *** %. Goal A1c < *** %.  ***  Plan: GENERAL: ***  MEDICATIONS: ***  EDUCATION / INSTRUCTIONS: BG monitoring instructions: Patient is instructed to check his blood sugars *** times a day, ***. Call [facility] if: BG persistently < 70  I reviewed the Rule of 15 for the treatment of hypoglycemia in detail with the patient. Literature supplied.   2) Diabetic complications:  Eye: Does *** have known diabetic retinopathy.   Neuro/ Feet: Does *** have known diabetic peripheral neuropathy. Renal: Patient does not have known baseline CKD. He is *** on an ACEI/ARB at present.  3) Lipids: Patient is *** on a statin.    4) Hypertension: ***  at goal of < 140/90 mmHg.       Signed electronically by: Lyndle Herrlich, MD  [facility]  [facility] [address]ure[address] [place], [place] [address] Phone: [phone] FAX: [phone]   CC: Jarrett Soho, PA-C [address] [place] [address] Phone: [phone]  Fax: [phone]    Return to Endocrinology clinic as below: Future Appointments  Date Time Provider Department Center  10/25/2021 10:30 AM Caylin Raby, Konrad Dolores, MD [id number] None

## 2021-11-08 DIAGNOSIS — E1169 Type 2 diabetes mellitus with other specified complication: Secondary | ICD-10-CM | POA: Diagnosis not present

## 2021-12-07 DIAGNOSIS — M79671 Pain in right foot: Secondary | ICD-10-CM | POA: Diagnosis not present

## 2021-12-07 DIAGNOSIS — Z79899 Other long term (current) drug therapy: Secondary | ICD-10-CM | POA: Diagnosis not present

## 2021-12-07 DIAGNOSIS — Z5181 Encounter for therapeutic drug level monitoring: Secondary | ICD-10-CM | POA: Diagnosis not present

## 2021-12-07 DIAGNOSIS — Z9689 Presence of other specified functional implants: Secondary | ICD-10-CM | POA: Diagnosis not present

## 2021-12-07 DIAGNOSIS — T8131XS Disruption of external operation (surgical) wound, not elsewhere classified, sequela: Secondary | ICD-10-CM | POA: Diagnosis not present

## 2021-12-07 DIAGNOSIS — G8921 Chronic pain due to trauma: Secondary | ICD-10-CM | POA: Diagnosis not present

## 2022-01-03 DIAGNOSIS — E7800 Pure hypercholesterolemia, unspecified: Secondary | ICD-10-CM | POA: Diagnosis not present

## 2022-01-03 DIAGNOSIS — I10 Essential (primary) hypertension: Secondary | ICD-10-CM | POA: Diagnosis not present

## 2022-01-03 DIAGNOSIS — K22719 Barrett's esophagus with dysplasia, unspecified: Secondary | ICD-10-CM | POA: Diagnosis not present

## 2022-01-03 DIAGNOSIS — E1169 Type 2 diabetes mellitus with other specified complication: Secondary | ICD-10-CM | POA: Diagnosis not present

## 2022-01-03 DIAGNOSIS — E039 Hypothyroidism, unspecified: Secondary | ICD-10-CM | POA: Diagnosis not present

## 2022-01-03 DIAGNOSIS — K219 Gastro-esophageal reflux disease without esophagitis: Secondary | ICD-10-CM | POA: Diagnosis not present

## 2022-01-03 DIAGNOSIS — L720 Epidermal cyst: Secondary | ICD-10-CM | POA: Diagnosis not present

## 2022-01-20 DIAGNOSIS — E785 Hyperlipidemia, unspecified: Secondary | ICD-10-CM | POA: Diagnosis not present

## 2022-01-20 DIAGNOSIS — E1169 Type 2 diabetes mellitus with other specified complication: Secondary | ICD-10-CM | POA: Diagnosis not present

## 2022-01-20 DIAGNOSIS — I10 Essential (primary) hypertension: Secondary | ICD-10-CM | POA: Diagnosis not present

## 2022-01-20 DIAGNOSIS — E1165 Type 2 diabetes mellitus with hyperglycemia: Secondary | ICD-10-CM | POA: Diagnosis not present

## 2022-01-20 DIAGNOSIS — E039 Hypothyroidism, unspecified: Secondary | ICD-10-CM | POA: Diagnosis not present

## 2022-01-27 DIAGNOSIS — Z23 Encounter for immunization: Secondary | ICD-10-CM | POA: Diagnosis not present

## 2022-02-07 DIAGNOSIS — Z9689 Presence of other specified functional implants: Secondary | ICD-10-CM | POA: Diagnosis not present

## 2022-02-07 DIAGNOSIS — G894 Chronic pain syndrome: Secondary | ICD-10-CM | POA: Diagnosis not present

## 2022-02-07 DIAGNOSIS — M79671 Pain in right foot: Secondary | ICD-10-CM | POA: Diagnosis not present

## 2022-02-21 DIAGNOSIS — I10 Essential (primary) hypertension: Secondary | ICD-10-CM | POA: Diagnosis not present

## 2022-02-21 DIAGNOSIS — E1169 Type 2 diabetes mellitus with other specified complication: Secondary | ICD-10-CM | POA: Diagnosis not present

## 2022-02-21 DIAGNOSIS — E039 Hypothyroidism, unspecified: Secondary | ICD-10-CM | POA: Diagnosis not present

## 2022-02-21 DIAGNOSIS — K219 Gastro-esophageal reflux disease without esophagitis: Secondary | ICD-10-CM | POA: Diagnosis not present

## 2022-02-21 DIAGNOSIS — E7800 Pure hypercholesterolemia, unspecified: Secondary | ICD-10-CM | POA: Diagnosis not present

## 2022-02-25 DIAGNOSIS — E1169 Type 2 diabetes mellitus with other specified complication: Secondary | ICD-10-CM | POA: Diagnosis not present

## 2022-02-25 DIAGNOSIS — E039 Hypothyroidism, unspecified: Secondary | ICD-10-CM | POA: Diagnosis not present

## 2022-03-21 DIAGNOSIS — M79671 Pain in right foot: Secondary | ICD-10-CM | POA: Diagnosis not present

## 2022-03-21 DIAGNOSIS — Z9689 Presence of other specified functional implants: Secondary | ICD-10-CM | POA: Diagnosis not present

## 2022-03-21 DIAGNOSIS — G8921 Chronic pain due to trauma: Secondary | ICD-10-CM | POA: Diagnosis not present

## 2022-04-20 DIAGNOSIS — E1165 Type 2 diabetes mellitus with hyperglycemia: Secondary | ICD-10-CM | POA: Diagnosis not present

## 2022-04-20 DIAGNOSIS — E785 Hyperlipidemia, unspecified: Secondary | ICD-10-CM | POA: Diagnosis not present

## 2022-04-20 DIAGNOSIS — I10 Essential (primary) hypertension: Secondary | ICD-10-CM | POA: Diagnosis not present

## 2022-04-20 DIAGNOSIS — E039 Hypothyroidism, unspecified: Secondary | ICD-10-CM | POA: Diagnosis not present

## 2022-04-20 DIAGNOSIS — F17200 Nicotine dependence, unspecified, uncomplicated: Secondary | ICD-10-CM | POA: Diagnosis not present

## 2022-05-05 DIAGNOSIS — I10 Essential (primary) hypertension: Secondary | ICD-10-CM | POA: Diagnosis not present

## 2022-05-05 DIAGNOSIS — K219 Gastro-esophageal reflux disease without esophagitis: Secondary | ICD-10-CM | POA: Diagnosis not present

## 2022-05-05 DIAGNOSIS — E039 Hypothyroidism, unspecified: Secondary | ICD-10-CM | POA: Diagnosis not present

## 2022-05-05 DIAGNOSIS — E1169 Type 2 diabetes mellitus with other specified complication: Secondary | ICD-10-CM | POA: Diagnosis not present

## 2022-05-05 DIAGNOSIS — E7800 Pure hypercholesterolemia, unspecified: Secondary | ICD-10-CM | POA: Diagnosis not present

## 2022-05-25 DIAGNOSIS — G8921 Chronic pain due to trauma: Secondary | ICD-10-CM | POA: Diagnosis not present

## 2022-05-25 DIAGNOSIS — Z9689 Presence of other specified functional implants: Secondary | ICD-10-CM | POA: Diagnosis not present

## 2022-05-25 DIAGNOSIS — M79671 Pain in right foot: Secondary | ICD-10-CM | POA: Diagnosis not present

## 2022-06-02 DIAGNOSIS — E7800 Pure hypercholesterolemia, unspecified: Secondary | ICD-10-CM | POA: Diagnosis not present

## 2022-06-02 DIAGNOSIS — K219 Gastro-esophageal reflux disease without esophagitis: Secondary | ICD-10-CM | POA: Diagnosis not present

## 2022-06-02 DIAGNOSIS — E1169 Type 2 diabetes mellitus with other specified complication: Secondary | ICD-10-CM | POA: Diagnosis not present

## 2022-06-02 DIAGNOSIS — I10 Essential (primary) hypertension: Secondary | ICD-10-CM | POA: Diagnosis not present

## 2022-06-02 DIAGNOSIS — E039 Hypothyroidism, unspecified: Secondary | ICD-10-CM | POA: Diagnosis not present

## 2022-06-22 DIAGNOSIS — E1165 Type 2 diabetes mellitus with hyperglycemia: Secondary | ICD-10-CM | POA: Diagnosis not present

## 2022-06-22 DIAGNOSIS — E785 Hyperlipidemia, unspecified: Secondary | ICD-10-CM | POA: Diagnosis not present

## 2022-06-22 DIAGNOSIS — E039 Hypothyroidism, unspecified: Secondary | ICD-10-CM | POA: Diagnosis not present

## 2022-06-22 DIAGNOSIS — I10 Essential (primary) hypertension: Secondary | ICD-10-CM | POA: Diagnosis not present

## 2022-06-28 DIAGNOSIS — Z79899 Other long term (current) drug therapy: Secondary | ICD-10-CM | POA: Diagnosis not present

## 2022-06-28 DIAGNOSIS — Z9689 Presence of other specified functional implants: Secondary | ICD-10-CM | POA: Diagnosis not present

## 2022-06-28 DIAGNOSIS — G894 Chronic pain syndrome: Secondary | ICD-10-CM | POA: Diagnosis not present

## 2022-06-28 DIAGNOSIS — Z5181 Encounter for therapeutic drug level monitoring: Secondary | ICD-10-CM | POA: Diagnosis not present

## 2022-06-28 DIAGNOSIS — M79671 Pain in right foot: Secondary | ICD-10-CM | POA: Diagnosis not present

## 2022-09-15 DIAGNOSIS — G8921 Chronic pain due to trauma: Secondary | ICD-10-CM | POA: Diagnosis not present

## 2022-09-15 DIAGNOSIS — Z9689 Presence of other specified functional implants: Secondary | ICD-10-CM | POA: Diagnosis not present

## 2022-09-15 DIAGNOSIS — M79671 Pain in right foot: Secondary | ICD-10-CM | POA: Diagnosis not present

## 2022-10-17 DIAGNOSIS — Z0000 Encounter for general adult medical examination without abnormal findings: Secondary | ICD-10-CM | POA: Diagnosis not present

## 2022-11-17 DIAGNOSIS — M79671 Pain in right foot: Secondary | ICD-10-CM | POA: Diagnosis not present

## 2022-11-17 DIAGNOSIS — G894 Chronic pain syndrome: Secondary | ICD-10-CM | POA: Diagnosis not present

## 2022-11-17 DIAGNOSIS — Z9689 Presence of other specified functional implants: Secondary | ICD-10-CM | POA: Diagnosis not present

## 2022-12-05 ENCOUNTER — Other Ambulatory Visit: Payer: Self-pay

## 2022-12-05 ENCOUNTER — Emergency Department (HOSPITAL_BASED_OUTPATIENT_CLINIC_OR_DEPARTMENT_OTHER)
Admission: EM | Admit: 2022-12-05 | Discharge: 2022-12-05 | Disposition: A | Discharge status: Home / Self Care | Payer: Medicare Other | Attending: Emergency Medicine | Admitting: Emergency Medicine

## 2022-12-05 ENCOUNTER — Encounter (HOSPITAL_BASED_OUTPATIENT_CLINIC_OR_DEPARTMENT_OTHER): Payer: Self-pay | Admitting: Emergency Medicine

## 2022-12-05 ENCOUNTER — Emergency Department (HOSPITAL_BASED_OUTPATIENT_CLINIC_OR_DEPARTMENT_OTHER): Payer: Medicare Other

## 2022-12-05 DIAGNOSIS — R2242 Localized swelling, mass and lump, left lower limb: Secondary | ICD-10-CM | POA: Diagnosis present

## 2022-12-05 DIAGNOSIS — L03116 Cellulitis of left lower limb: Secondary | ICD-10-CM | POA: Insufficient documentation

## 2022-12-05 DIAGNOSIS — R600 Localized edema: Secondary | ICD-10-CM | POA: Diagnosis not present

## 2022-12-05 DIAGNOSIS — M7989 Other specified soft tissue disorders: Secondary | ICD-10-CM | POA: Diagnosis not present

## 2022-12-05 LAB — BASIC METABOLIC PANEL
Anion gap: 9 (ref 5–15)
BUN: 5 mg/dL — ABNORMAL LOW (ref 6–20)
CO2: 23 mmol/L (ref 22–32)
Calcium: 8.2 mg/dL — ABNORMAL LOW (ref 8.9–10.3)
Chloride: 105 mmol/L (ref 98–111)
Creatinine, Ser: 0.77 mg/dL (ref 0.61–1.24)
GFR, Estimated: >60 mL/min (ref >60)
Glucose, Bld: 186 mg/dL — ABNORMAL HIGH (ref 70–99)
Potassium: 3.9 mmol/L (ref 3.5–5.1)
Sodium: 137 mmol/L (ref 135–145)

## 2022-12-05 LAB — CBC
HCT: 38.8 % — ABNORMAL LOW (ref 39.0–52.0)
Hemoglobin: 13.3 g/dL (ref 13.0–17.0)
MCH: 33.1 pg (ref 26.0–34.0)
MCHC: 34.3 g/dL (ref 30.0–36.0)
MCV: 96.5 fL (ref 80.0–100.0)
Platelets: 261 10*3/uL (ref 150–400)
RBC: 4.02 MIL/uL — ABNORMAL LOW (ref 4.22–5.81)
RDW: 13.9 % (ref 11.5–15.5)
WBC: 13.6 10*3/uL — ABNORMAL HIGH (ref 4.0–10.5)
nRBC: 0 % (ref 0.0–0.2)

## 2022-12-05 LAB — TROPONIN I (HIGH SENSITIVITY)
Troponin I (High Sensitivity): 5 ng/L (ref <18)
Troponin I (High Sensitivity): 5 ng/L (ref <18)

## 2022-12-05 MED ORDER — OXYCODONE HCL 5 MG PO TABS
5.0000 mg | ORAL_TABLET | Freq: Once | ORAL | Status: AC
  Administered 2022-12-05: 5 mg via ORAL
  Filled 2022-12-05: qty 1

## 2022-12-05 MED ORDER — SODIUM CHLORIDE 0.9 % IV SOLN
2.0000 g | INTRAVENOUS | Status: DC
  Administered 2022-12-05: 2 g via INTRAVENOUS
  Filled 2022-12-05: qty 20

## 2022-12-05 MED ORDER — CEPHALEXIN 500 MG PO CAPS: 500.0000 mg | ORAL_CAPSULE | Freq: Four times a day (QID) | ORAL | 0 refills | Status: DC

## 2022-12-05 MED ORDER — CEPHALEXIN 500 MG PO CAPS
500.0000 mg | ORAL_CAPSULE | Freq: Four times a day (QID) | ORAL | 0 refills | Status: AC
Start: 2022-12-05 — End: ?

## 2022-12-05 NOTE — ED Provider Notes (Signed)
[facility] AT [facility] Provider Note   CSN: [id number] Arrival date & time: 12/05/22  1620     History  Chief Complaint  Patient presents with   Leg Swelling    Adam [name] is a 57 y.o. male.  Patient to ED from PCP office for evaluation of pain, redness and swelling of the left lower extremity x 4-5 days, progressively worsening. No fever. He reports history of blood clot in the same leg in the past but feels the symptoms are different. No known injury or trauma. The swelling started at the foot and ankle and now extends to the knee.   The history is provided by the patient. No language interpreter was used.       Home Medications Prior to Admission medications   Medication Sig Start Date End Date Taking? Authorizing Provider  glipiZIDE (GLUCOTROL) 10 MG tablet Take 10 mg by mouth daily before breakfast.    [provider]  hydrOXYzine (ATARAX/VISTARIL) 10 MG tablet Take 10 mg by mouth 3 (three) times daily as needed for anxiety.    [provider]  ibuprofen (ADVIL) 800 MG tablet Take 1 tablet (800 mg total) by mouth every 8 (eight) hours as needed. 03/10/21   Cornett, Maisie Fus, MD  levothyroxine (SYNTHROID, LEVOTHROID) 112 MCG tablet Take 112 mcg by mouth daily before breakfast.     [provider]  lisinopril-hydrochlorothiazide (PRINZIDE,ZESTORETIC) 20-25 MG per tablet Take 1 tablet by mouth daily.    [provider]  metFORMIN (GLUCOPHAGE) 500 MG tablet Take 1,000 mg by mouth 2 (two) times daily with a meal.     [provider]  oxyCODONE (OXY IR/ROXICODONE) 5 MG immediate release tablet Take 1 tablet (5 mg total) by mouth every 6 (six) hours as needed for severe pain. 03/10/21   Cornett, Maisie Fus, MD  oxycodone (ROXICODONE) 30 MG immediate release tablet Take 30 mg by mouth 2 (two) times daily as needed. 06/29/15   [provider]  oxyCODONE ER 27 MG C12A Take 27 mg by mouth in the morning  and at bedtime.    [provider]  pravastatin (PRAVACHOL) 40 MG tablet Take 40 mg by mouth daily.    [provider]  propranolol (INDERAL) 20 MG tablet Take 20 mg by mouth 2 (two) times daily.    [provider]  Semaglutide,0.25 or 0.5MG /DOS, (OZEMPIC, 0.25 OR 0.5 MG/DOSE,) 2 MG/1.5ML SOPN Inject 0.5 mg into the skin once a week.    [provider]  Semaglutide,0.25 or 0.5MG /DOS, 2 MG/1.5ML SOPN Inject 0.5 mg subcutaneously once a week 04/14/21     sertraline (ZOLOFT) 50 MG tablet Take 50 mg by mouth daily.    [provider]      Allergies    Augmentin [amoxicillin-pot clavulanate]    Review of Systems   Review of Systems  Physical Exam Updated Vital Signs BP (!) 121/95   Pulse 73   Temp 98.2 F (36.8 C) (Oral)   Resp 18   Ht 6\' 3"  (1.905 m)   Wt (!) 161.3 kg   SpO2 90%   BMI 44.45 kg/m  Physical Exam Vitals and nursing note reviewed.  Constitutional:      General: He is not in acute distress.    Appearance: He is obese.  Cardiovascular:     Rate and Rhythm: Normal rate and regular rhythm.  Pulmonary:     Effort: Pulmonary effort is normal.  Abdominal:     Palpations: Abdomen  is soft.     Tenderness: There is no abdominal tenderness.  Musculoskeletal:        General: Normal range of motion.     Comments: Left > right foot swelling. Left lower leg is moderately swollen extending to knee.    Skin:    General: Skin is dry.     Findings: Erythema present.     Comments: There is erythema of the left lower extremity involving the foot, ankle and distal lower leg. Minimal warmth to touch. No open lesion or wound.  Neurological:     Mental Status: He is alert.     Sensory: No sensory deficit.     ED Results / Procedures / Treatments   Labs (all labs ordered are listed, but only abnormal results are displayed) Labs Reviewed  BASIC METABOLIC PANEL - Abnormal; Notable for the following components:      Result Value    Glucose, Bld 186 (*)    BUN 5 (*)    Calcium 8.2 (*)    All other components within normal limits  CBC - Abnormal; Notable for the following components:   WBC 13.6 (*)    RBC 4.02 (*)    HCT 38.8 (*)    All other components within normal limits  TROPONIN I (HIGH SENSITIVITY)  TROPONIN I (HIGH SENSITIVITY)    EKG EKG Interpretation Date/Time:  Monday December 05 2022 16:30:40 EDT Ventricular Rate:  79 PR Interval:  184 QRS Duration:  86 QT Interval:  388 QTC Calculation: 444 R Axis:   52  Text Interpretation: Normal sinus rhythm Cannot rule out Anterior infarct , age undetermined Abnormal ECG When compared with ECG of 08-Mar-2021 15:17, No significant change was found Confirmed by Fulton Reek [phone]) on 12/05/2022 4:42:46 PM  Radiology US Venous Img Lower Unilateral Left  Result Date: 12/05/2022 CLINICAL DATA:  Left lower extremity edema. EXAM: LEFT LOWER EXTREMITY VENOUS DOPPLER ULTRASOUND TECHNIQUE: Gray-scale sonography with graded compression, as well as color Doppler and duplex ultrasound were performed to evaluate the lower extremity deep venous systems from the level of the common femoral vein and including the common femoral, femoral, profunda femoral, popliteal and calf veins including the posterior tibial, peroneal and gastrocnemius veins when visible. The superficial great saphenous vein was also interrogated. Spectral Doppler was utilized to evaluate flow at rest and with distal augmentation maneuvers in the common femoral, femoral and popliteal veins. COMPARISON:  None Available. FINDINGS: Contralateral Common Femoral Vein: Respiratory phasicity is normal and symmetric with the symptomatic side. No evidence of thrombus. Normal compressibility. Common Femoral Vein: No evidence of thrombus. Normal compressibility, respiratory phasicity and response to augmentation. Saphenofemoral Junction: No evidence of thrombus. Normal compressibility and flow on color Doppler imaging.  Profunda Femoral Vein: No evidence of thrombus. Normal compressibility and flow on color Doppler imaging. Femoral Vein: No evidence of thrombus. Normal compressibility, respiratory phasicity and response to augmentation. Popliteal Vein: No evidence of thrombus. Normal compressibility, respiratory phasicity and response to augmentation. Calf Veins: No evidence of thrombus. Normal compressibility and flow on color Doppler imaging. Superficial Great Saphenous Vein: No evidence of thrombus. Normal compressibility. Venous Reflux:  None. Other Findings: No evidence of superficial thrombophlebitis or abnormal fluid collection. IMPRESSION: No evidence of left lower extremity deep venous thrombosis. Electronically Signed   By: Irish Lack M.D.   On: 12/05/2022 20:33    Procedures Procedures    Medications Ordered in ED Medications  cefTRIAXone (ROCEPHIN) 2 g in sodium chloride 0.9 % 100 mL  IVPB (2 g Intravenous New Bag/Given 12/05/22 2121)  oxyCODONE (Oxy IR/ROXICODONE) immediate release tablet 5 mg (5 mg Oral Given 12/05/22 1902)    ED Course/ Medical Decision Making/ A&P Clinical Course as of 12/05/22 2122  Mon Dec 05, 2022  1820 Patient with history of lower extremity clot in the past here from PCP office for evaluation of new swelling and discomfort of the left LE. There is erythema and mild leukocytosis adding cellulitis to the differential. Doppler of the leg pending.  [SU]  1912 Patient requesting pain medication. On oxycodone regularly. His regular dose ordered for comfort.  [SU]  2119 Doppler study negative for DVT. Favor cellulitis. Given h/o DM, IV rocephin provided, will discharge home on Keflex. The patient is seen by Dr. Earlene Plater in the ED who agrees with plan of care.  [SU]    Clinical Course User Index [SU] Elpidio Anis, PA-C                                 Medical Decision Making Amount and/or Complexity of Data Reviewed Labs: ordered.  Risk Prescription drug  management.           Final Clinical Impression(s) / ED Diagnoses Final diagnoses:  Cellulitis of left lower extremity    Rx / DC Orders ED Discharge Orders     None         Danne Harbor 12/05/22 2123    Laurence Spates, MD 12/06/22 [phone]

## 2022-12-05 NOTE — ED Triage Notes (Signed)
Pt via pov from pcp office with swelling to his left leg, ankle, foot. Pt had previous blood clot in this leg, but states this feels different. Pt reports this began on Wednesday. Pt alert & oriented, nad noted.

## 2022-12-05 NOTE — Discharge Instructions (Signed)
It is important to take the antibiotic as prescribed. Continue all your regular medications as well.   Follow up with your doctor for recheck in one week, sooner if symptoms are not improving. Return to the ED with any worsening condition.

## 2022-12-05 NOTE — ED Notes (Signed)
Pt verbalized understanding of d/c instructions, meds, and followup care. Denies questions. VSS, no distress noted. Steady gait to exit with all belongings.  ?

## 2022-12-14 DIAGNOSIS — L03116 Cellulitis of left lower limb: Secondary | ICD-10-CM | POA: Diagnosis not present

## 2022-12-14 DIAGNOSIS — T50905A Adverse effect of unspecified drugs, medicaments and biological substances, initial encounter: Secondary | ICD-10-CM | POA: Diagnosis not present

## 2022-12-14 DIAGNOSIS — R7981 Abnormal blood-gas level: Secondary | ICD-10-CM | POA: Diagnosis not present

## 2022-12-16 DIAGNOSIS — L03116 Cellulitis of left lower limb: Secondary | ICD-10-CM | POA: Diagnosis not present

## 2022-12-16 DIAGNOSIS — E1165 Type 2 diabetes mellitus with hyperglycemia: Secondary | ICD-10-CM | POA: Diagnosis not present

## 2022-12-20 DIAGNOSIS — K219 Gastro-esophageal reflux disease without esophagitis: Secondary | ICD-10-CM | POA: Diagnosis not present

## 2022-12-20 DIAGNOSIS — E039 Hypothyroidism, unspecified: Secondary | ICD-10-CM | POA: Diagnosis not present

## 2022-12-20 DIAGNOSIS — M793 Panniculitis, unspecified: Secondary | ICD-10-CM | POA: Diagnosis not present

## 2022-12-20 DIAGNOSIS — E1165 Type 2 diabetes mellitus with hyperglycemia: Secondary | ICD-10-CM | POA: Diagnosis not present

## 2022-12-20 DIAGNOSIS — L03116 Cellulitis of left lower limb: Secondary | ICD-10-CM | POA: Diagnosis not present

## 2022-12-20 DIAGNOSIS — I10 Essential (primary) hypertension: Secondary | ICD-10-CM | POA: Diagnosis not present

## 2022-12-20 DIAGNOSIS — K22719 Barrett's esophagus with dysplasia, unspecified: Secondary | ICD-10-CM | POA: Diagnosis not present

## 2022-12-20 DIAGNOSIS — F17210 Nicotine dependence, cigarettes, uncomplicated: Secondary | ICD-10-CM | POA: Diagnosis not present

## 2022-12-20 DIAGNOSIS — E7800 Pure hypercholesterolemia, unspecified: Secondary | ICD-10-CM | POA: Diagnosis not present

## 2022-12-30 ENCOUNTER — Other Ambulatory Visit: Payer: Self-pay

## 2022-12-30 DIAGNOSIS — Z122 Encounter for screening for malignant neoplasm of respiratory organs: Secondary | ICD-10-CM

## 2022-12-30 DIAGNOSIS — Z87891 Personal history of nicotine dependence: Secondary | ICD-10-CM

## 2022-12-30 DIAGNOSIS — F17210 Nicotine dependence, cigarettes, uncomplicated: Secondary | ICD-10-CM

## 2023-01-03 DIAGNOSIS — M79671 Pain in right foot: Secondary | ICD-10-CM | POA: Diagnosis not present

## 2023-01-03 DIAGNOSIS — Z79899 Other long term (current) drug therapy: Secondary | ICD-10-CM | POA: Diagnosis not present

## 2023-01-03 DIAGNOSIS — Z5181 Encounter for therapeutic drug level monitoring: Secondary | ICD-10-CM | POA: Diagnosis not present

## 2023-01-03 DIAGNOSIS — G894 Chronic pain syndrome: Secondary | ICD-10-CM | POA: Diagnosis not present

## 2023-01-03 DIAGNOSIS — Z9689 Presence of other specified functional implants: Secondary | ICD-10-CM | POA: Diagnosis not present

## 2023-01-06 ENCOUNTER — Ambulatory Visit (INDEPENDENT_AMBULATORY_CARE_PROVIDER_SITE_OTHER): Payer: Medicare Other | Admitting: Adult Health

## 2023-01-06 ENCOUNTER — Encounter: Payer: Self-pay | Admitting: Adult Health

## 2023-01-06 DIAGNOSIS — F17210 Nicotine dependence, cigarettes, uncomplicated: Secondary | ICD-10-CM | POA: Diagnosis not present

## 2023-01-06 NOTE — Patient Instructions (Addendum)
Thank you for participating in the [facility]. It was our pleasure to meet you today. We will call you with the results of your scan within the next few days. Your scan will be assigned a Lung RADS category score by the physicians reading the scans.  This Lung RADS score determines follow up scanning.  See below for description of categories, and follow up screening recommendations. We will be in touch to schedule your follow up screening annually or based on recommendations of our providers. We will fax a copy of your scan results to your Primary Care Physician, or the physician who referred you to the program, to ensure they have the results. Please call the office if you have any questions or concerns regarding your scanning experience or results.  Our office number is [phone]. Please speak with Abigail Miyamoto, RN., Karlton Lemon RN, or Pietro Cassis RN. They are  our Lung Cancer Screening RN.'s If They are unavailable when you call, Please leave a message on the voice mail. We will return your call at our earliest convenience.This voice mail is monitored several times a day.  Remember, if your scan is normal, we will scan you annually as long as you continue to meet the criteria for the program. (Age 42-80, Current smoker or smoker who has quit within the last 15 years). If you are a smoker, remember, quitting is the single most powerful action that you can take to decrease your risk of lung cancer and other pulmonary, breathing related problems. We know quitting is hard, and we are here to help.  Please let us know if there is anything we can do to help you meet your goal of quitting. If you are a former smoker, Counselling psychologist. We are proud of you! Remain smoke free! Remember you can refer friends or family members through the number above.  We will screen them to make sure they meet criteria for the program. Thank you for helping [place] take better care of you  by participating in Lung Screening.   Lung RADS Categories:  Lung RADS 1: no nodules or definitely non-concerning nodules.  Recommendation is for a repeat annual scan in 12 months.  Lung RADS 2:  nodules that are non-concerning in appearance and behavior with a very low likelihood of becoming an active cancer. Recommendation is for a repeat annual scan in 12 months.  Lung RADS 3: nodules that are probably non-concerning , includes nodules with a low likelihood of becoming an active cancer.  Recommendation is for a 55-month repeat screening scan. Often noted after an upper respiratory illness. We will be in touch to make sure you have no questions, and to schedule your 45-month scan.  Lung RADS 4 A: nodules with concerning findings, recommendation is most often for a follow up scan in 3 months or additional testing based on our provider's assessment of the scan. We will be in touch to make sure you have no questions and to schedule the recommended 3 month follow up scan.  Lung RADS 4 B:  indicates findings that are concerning. We will be in touch with you to schedule additional diagnostic testing based on our provider's  assessment of the scan.  You can receive free nicotine replacement therapy ( patches, gum or mints) by calling 1-800-QUIT NOW. Please call so we can get you on the path to becoming  a non-smoker. I know it is hard, but you can do this!  Other options for assistance in  smoking cessation ( As covered by your insurance benefits)  Hypnosis for smoking cessation  [employer] [phone]  Acupuncture for smoking cessation  [employer] [phone]  Steps to Quit Smoking Smoking tobacco is the leading cause of preventable death. It can affect almost every organ in the body. Smoking puts you and people around you at risk for many serious, long-lasting (chronic) diseases. Quitting smoking can be hard, but it is one of the best things that you can do for your  health. It is never too late to quit. Do not give up if you cannot quit the first time. Some people need to try many times to quit. Do your best to stick to your quit plan, and talk with your doctor if you have any questions or concerns. How do I get ready to quit? Pick a date to quit. Set a date within the next 2 weeks to give you time to prepare. Write down the reasons why you are quitting. Keep this list in places where you will see it often. Tell your family, friends, and co-workers that you are quitting. Their support is important. Talk with your doctor about the choices that may help you quit. Find out if your health insurance will pay for these treatments. Know the people, places, things, and activities that make you want to smoke (triggers). Avoid them. What first steps can I take to quit smoking? Throw away all cigarettes at home, at work, and in your car. Throw away the things that you use when you smoke, such as ashtrays and lighters. Clean your car. Empty the ashtray. Clean your home, including curtains and carpets. What can I do to help me quit smoking? Talk with your doctor about taking medicines and seeing a counselor. You are more likely to succeed when you do both. If you are pregnant or breastfeeding: Talk with your doctor about counseling or other ways to quit smoking. Do not take medicine to help you quit smoking unless your doctor tells you to. Quit right away Quit smoking completely, instead of slowly cutting back on how much you smoke over a period of time. Stopping smoking right away may be more successful than slowly quitting. Go to counseling. In-person is best if this is an option. You are more likely to quit if you go to counseling sessions regularly. Take medicine You may take medicines to help you quit. Some medicines need a prescription, and some you can buy over-the-counter. Some medicines may contain a drug called nicotine to replace the nicotine in cigarettes.  Medicines may: Help you stop having the desire to smoke (cravings). Help to stop the problems that come when you stop smoking (withdrawal symptoms). Your doctor may ask you to use: Nicotine patches, gum, or lozenges. Nicotine inhalers or sprays. Non-nicotine medicine that you take by mouth. Find resources Find resources and other ways to help you quit smoking and remain smoke-free after you quit. They include: Online chats with a Veterinary surgeon. Phone quitlines. Printed Materials engineer. Support groups or group counseling. Text messaging programs. Mobile phone apps. Use apps on your mobile phone or tablet that can help you stick to your quit plan. Examples of free services include Quit Guide from the CDC and smokefree.gov  What can I do to make it easier to quit?  Talk to your family and friends. Ask them to support and encourage you. Call a phone quitline, such as 1-800-QUIT-NOW, reach out to support groups, or work with a Veterinary surgeon. Ask people  who smoke to not smoke around you. Avoid places that make you want to smoke, such as: Bars. Parties. Smoke-break areas at work. Spend time with people who do not smoke. Lower the stress in your life. Stress can make you want to smoke. Try these things to lower stress: Getting regular exercise. Doing deep-breathing exercises. Doing yoga. Meditating. What benefits will I see if I quit smoking? Over time, you may have: A better sense of smell and taste. Less coughing and sore throat. A slower heart rate. Lower blood pressure. Clearer skin. Better breathing. Fewer sick days. Summary Quitting smoking can be hard, but it is one of the best things that you can do for your health. Do not give up if you cannot quit the first time. Some people need to try many times to quit. When you decide to quit smoking, make a plan to help you succeed. Quit smoking right away, not slowly over a period of time. When you start quitting, get help and support  to keep you smoke-free. This information is not intended to replace advice given to you by your health care provider. Make sure you discuss any questions you have with your health care provider. Document Revised: 03/19/2021 Document Reviewed: 03/19/2021 Elsevier Patient Education  2024 [employer].

## 2023-01-06 NOTE — Progress Notes (Signed)
  Virtual Visit via Telephone Note  I connected with Adam [name]  , 01/06/23 10:39 AM by a telemedicine application and verified that I am speaking with the correct person using two identifiers.  Location: Patient: home Provider: home   I discussed the limitations of evaluation and management by telemedicine and the availability of in person appointments. The patient expressed understanding and agreed to proceed.   Shared Decision Making Visit Lung Cancer Screening Program [id number])   Eligibility: 57 y.o. Pack Years Smoking History Calculation =24 (# packs/per year x # years smoked)=24 x1ppd Recent History of coughing up blood  no Unexplained weight loss? no ( >Than 15 pounds within the last 6 months ) Prior History Lung / other cancer no (Diagnosis within the last 5 years already requiring surveillance chest CT Scans). Smoking Status Current Smoker  Visit Components: Discussion included one or more decision making aids. YES Discussion included risk/benefits of screening. YES Discussion included potential follow up diagnostic testing for abnormal scans. YES Discussion included meaning and risk of over diagnosis. YES Discussion included meaning and risk of False Positives. YES Discussion included meaning of total radiation exposure. YES  Counseling Included: Importance of adherence to annual lung cancer LDCT screening. YES Impact of comorbidities on ability to participate in the program. YES Ability and willingness to under diagnostic treatment. YES  Smoking Cessation Counseling: Current Smokers:  Discussed importance of smoking cessation. yes Information about tobacco cessation classes and interventions provided to patient. yes Patient provided with "ticket" for LDCT Scan. yes Symptomatic Patient. no  Counseling Diagnosis Code: Tobacco Use Z72.0 Asymptomatic Patient yes  Counseling (Intermediate counseling: > three minutes counseling) [id number] Information about  tobacco cessation classes and interventions provided to patient. Yes Patient provided with "ticket" for LDCT Scan. yes Written Order for Lung Cancer Screening with LDCT placed in Epic. Yes (CT Chest Lung Cancer Screening Low Dose W/O CM) [id number]  Z12.2-Screening of respiratory organs Z87.891-Personal history of nicotine dependence   [name] 01/06/23

## 2023-01-11 ENCOUNTER — Inpatient Hospital Stay: Admission: RE | Admit: 2023-01-11 | Payer: Medicare Other | Source: Ambulatory Visit

## 2023-01-24 ENCOUNTER — Ambulatory Visit
Admission: RE | Admit: 2023-01-24 | Discharge: 2023-01-24 | Disposition: A | Discharge status: Home / Self Care | Payer: Medicare Other | Source: Ambulatory Visit | Attending: Acute Care | Admitting: Acute Care

## 2023-01-24 DIAGNOSIS — F17210 Nicotine dependence, cigarettes, uncomplicated: Secondary | ICD-10-CM

## 2023-01-24 DIAGNOSIS — Z122 Encounter for screening for malignant neoplasm of respiratory organs: Secondary | ICD-10-CM

## 2023-01-24 DIAGNOSIS — Z87891 Personal history of nicotine dependence: Secondary | ICD-10-CM | POA: Diagnosis not present

## 2023-02-09 ENCOUNTER — Telehealth: Payer: Self-pay | Admitting: *Deleted

## 2023-02-09 NOTE — Telephone Encounter (Signed)
Pt called in asking about lung screening CT results. Advised pt that results have not been finalized yet but we will notify him as soon as we receive them. Pt verbalized understanding.

## 2023-02-10 ENCOUNTER — Other Ambulatory Visit: Payer: Self-pay | Admitting: Acute Care

## 2023-02-10 DIAGNOSIS — F17210 Nicotine dependence, cigarettes, uncomplicated: Secondary | ICD-10-CM

## 2023-02-10 DIAGNOSIS — Z87891 Personal history of nicotine dependence: Secondary | ICD-10-CM

## 2023-02-10 DIAGNOSIS — Z122 Encounter for screening for malignant neoplasm of respiratory organs: Secondary | ICD-10-CM

## 2023-02-10 NOTE — Telephone Encounter (Signed)
Called and spoke with pt regarding LDCT scan, Lung RADS 2. We discussed the findings and the recs for 12 month scan. Patient questioned if he could do colon screening as he has a family history of colon cancer. Advised pt to speak with his PCP about colon screenings and more frequent colonoscopies due to family history. Pt verbalized understanding and denied any further questions or concerns at this time.

## 2023-03-02 DIAGNOSIS — E1165 Type 2 diabetes mellitus with hyperglycemia: Secondary | ICD-10-CM | POA: Diagnosis not present

## 2023-03-02 DIAGNOSIS — E785 Hyperlipidemia, unspecified: Secondary | ICD-10-CM | POA: Diagnosis not present

## 2023-03-02 DIAGNOSIS — E039 Hypothyroidism, unspecified: Secondary | ICD-10-CM | POA: Diagnosis not present

## 2023-03-02 DIAGNOSIS — F17200 Nicotine dependence, unspecified, uncomplicated: Secondary | ICD-10-CM | POA: Diagnosis not present

## 2023-03-02 DIAGNOSIS — I10 Essential (primary) hypertension: Secondary | ICD-10-CM | POA: Diagnosis not present

## 2023-03-17 ENCOUNTER — Other Ambulatory Visit (HOSPITAL_BASED_OUTPATIENT_CLINIC_OR_DEPARTMENT_OTHER): Payer: Self-pay

## 2023-03-17 ENCOUNTER — Other Ambulatory Visit: Payer: Self-pay

## 2023-03-17 MED ORDER — MOUNJARO 2.5 MG/0.5ML ~~LOC~~ SOAJ
2.5000 mg | SUBCUTANEOUS | 1 refills | Status: AC
  Filled 2023-03-17: qty 2, 28d supply, fill #0

## 2023-03-27 DIAGNOSIS — F17210 Nicotine dependence, cigarettes, uncomplicated: Secondary | ICD-10-CM | POA: Diagnosis not present

## 2023-03-28 DIAGNOSIS — M79671 Pain in right foot: Secondary | ICD-10-CM | POA: Diagnosis not present

## 2023-03-28 DIAGNOSIS — G894 Chronic pain syndrome: Secondary | ICD-10-CM | POA: Diagnosis not present

## 2023-03-28 DIAGNOSIS — Z9689 Presence of other specified functional implants: Secondary | ICD-10-CM | POA: Diagnosis not present

## 2023-04-24 ENCOUNTER — Ambulatory Visit: Payer: Medicare Other | Admitting: Orthopedic Surgery

## 2023-05-01 DIAGNOSIS — E1165 Type 2 diabetes mellitus with hyperglycemia: Secondary | ICD-10-CM | POA: Diagnosis not present

## 2023-05-08 DIAGNOSIS — E039 Hypothyroidism, unspecified: Secondary | ICD-10-CM | POA: Diagnosis not present

## 2023-05-08 DIAGNOSIS — I10 Essential (primary) hypertension: Secondary | ICD-10-CM | POA: Diagnosis not present

## 2023-05-08 DIAGNOSIS — F17200 Nicotine dependence, unspecified, uncomplicated: Secondary | ICD-10-CM | POA: Diagnosis not present

## 2023-05-08 DIAGNOSIS — E1165 Type 2 diabetes mellitus with hyperglycemia: Secondary | ICD-10-CM | POA: Diagnosis not present

## 2023-05-08 DIAGNOSIS — E785 Hyperlipidemia, unspecified: Secondary | ICD-10-CM | POA: Diagnosis not present

## 2023-05-17 ENCOUNTER — Ambulatory Visit: Payer: Medicare Other | Admitting: Orthopedic Surgery

## 2023-05-17 ENCOUNTER — Other Ambulatory Visit (INDEPENDENT_AMBULATORY_CARE_PROVIDER_SITE_OTHER): Payer: Medicare Other

## 2023-05-17 DIAGNOSIS — M25561 Pain in right knee: Secondary | ICD-10-CM

## 2023-05-17 DIAGNOSIS — M2351 Chronic instability of knee, right knee: Secondary | ICD-10-CM

## 2023-05-18 ENCOUNTER — Encounter: Payer: Self-pay | Admitting: Orthopedic Surgery

## 2023-05-18 NOTE — Progress Notes (Signed)
 Office Visit Note   Patient: Adam [name]           Date of Birth: 12-02-1965           MRN: [id number] Visit Date: 05/17/2023 Requested by: Katina Pfeiffer, PA-C [address] [place],  [place] [address] PCP: Katina Pfeiffer, PA-C  Subjective: Chief Complaint  Patient presents with   Right Knee - Pain    HPI: Adam [name] is a 58 y.o. male who presents to the office reporting right knee pain for 5 months.  Patient states the pain wakes him from sleep at night.  He states that he feels like his knee will explode at times.  He does ambulate with a cane.  Doing well with his left total knee replacement.  Patient states that he feels like there is a flap in his knee.  He cannot have an MRI scan because of a spinal cord stimulator for right ankle problem.  He did have right knee arthroscopy in his early 54s.  He does report weakness and giving way in that right knee..                ROS: All systems reviewed are negative as they relate to the chief complaint within the history of present illness.  Patient denies fevers or chills.  Assessment & Plan: Visit Diagnoses:  1. Right knee pain, unspecified chronicity     Plan: Impression is right knee pain.  Radiographs underwhelming in terms of arthritis or malalignment.  He may have some meniscal pathology.  He is having some instability but his ligaments feel stable on exam.  He has used a brace in the past with good results.  His quad to calf ratio is on the high side which may make off-the-shelf bracing not feasible.  I think a custom brace could be helpful to keep him ambulating.  CT arthrogram could also be considered to more fully evaluate that right knee but wants to try a brace instead.  He will follow-up with us  as needed.  Follow-Up Instructions: No follow-ups on file.   Orders:  Orders Placed This Encounter  Procedures   XR KNEE 3 VIEW RIGHT   No orders of the defined types were placed in this encounter.      Procedures: No procedures performed   Clinical Data: No additional findings.  Objective: Vital Signs: There were no vitals taken for this visit.  Physical Exam:  Constitutional: Patient appears well-developed HEENT:  Head: Normocephalic Eyes:EOM are normal Neck: Normal range of motion Cardiovascular: Normal rate Pulmonary/chest: Effort normal Neurologic: Patient is alert Skin: Skin is warm Psychiatric: Patient has normal mood and affect  Ortho Exam: Ortho exam demonstrates normal gait alignment.  Collateral crucial ligaments feel stable in the right knee with no effusion.  Mild patellofemoral crepitus is present in that right knee.  Extensor mechanism intact.  No masses lymphadenopathy or skin changes noted in that right knee region.  Specialty Comments:  No specialty comments available.  Imaging: No results found.   PMFS History: Patient Active Problem List   Diagnosis Date Noted   Arthritis of knee 11/07/2017   Unilateral primary osteoarthritis, left knee    GERD (gastroesophageal reflux disease) 09/25/2015   Superficial thrombophlebitis of left leg 07/28/2015   Tobacco abuse counseling 07/28/2015   Morbid obesity due to excess calories (HCC) 07/28/2015   Financial difficulties 07/28/2015   Left peroneal vein thrombosis (HCC) 07/13/2015   Past Medical History:  Diagnosis Date  Anxiety    Arthritis    Chronic pain    Depression    Diabetes mellitus without complication (HCC)    Full dentures    GERD (gastroesophageal reflux disease)    Hyperlipemia    Hypertension    Hypothyroidism    Peripheral neuropathic pain    Pleurisy    as a kid; had lung drained. Scar tissue on the right lung.    Sleep apnea    hx-had corrective surgery   Spinal cord stimulator status     Family History  Problem Relation Age of Onset   Clotting disorder Mother     Past Surgical History:  Procedure Laterality Date   ANKLE ARTHROPLASTY  2005   right ankle injury    CHONDROPLASTY Left 04/14/2014   Procedure: CHONDROPLASTY Patella and tibial condyle;  Surgeon: Dempsey JINNY Sensor, MD;  Location: [facility];  Service: Orthopedics;  Laterality: Left;   HYDRADENITIS EXCISION Bilateral 03/10/2021   Procedure: EXCISION HIDRADENITIS bilateral groin;  Surgeon: Vanderbilt Ned, MD;  Location: [facility];  Service: General;  Laterality: Bilateral;   I & D EXTREMITY  2005   right ankle   KNEE ARTHROSCOPY     left x2   KNEE ARTHROSCOPY Left 04/14/2014   Procedure: LEFT KNEE ARTHROSCOPY removal loose body;  Surgeon: Dempsey JINNY Sensor, MD;  Location: [facility];  Service: Orthopedics;  Laterality: Left;   KNEE ARTHROSCOPY WITH LATERAL MENISECTOMY Left 04/14/2014   Procedure: KNEE ARTHROSCOPY WITH LATERAL MENISECTOMY;  Surgeon: Dempsey JINNY Sensor, MD;  Location: [facility];  Service: Orthopedics;  Laterality: Left;   KNEE ARTHROSCOPY WITH MEDIAL MENISECTOMY Left 04/14/2014   Procedure: KNEE ARTHROSCOPY WITH MEDIAL MENISECTOMY;  Surgeon: Dempsey JINNY Sensor, MD;  Location:  SURGERY CENTER;  Service: Orthopedics;  Laterality: Left;   MULTIPLE TOOTH EXTRACTIONS  2015   SPINAL CORD STIMULATOR IMPLANT  2008   SPINAL CORD STIMULATOR IMPLANT  2008   revision   TOTAL KNEE ARTHROPLASTY Left 11/07/2017   TOTAL KNEE ARTHROPLASTY Left 11/07/2017   Procedure: LEFT TOTAL KNEE ARTHROPLASTY;  Surgeon: Addie Cordella Hamilton, MD;  [facility];  Service: Orthopedics;  Laterality: Left;   UVULOPALATOPHARYNGOPLASTY  2000   Social History   Occupational History   Not on file  Tobacco Use   Smoking status: Every Day    Current packs/day: 1.00    Average packs/day: 1 pack/day for 51.1 years (51.1 ttl pk-yrs)    Types: Cigarettes    Start date: 04/11/1993   Smokeless tobacco: Never  Vaping Use   Vaping status: Never Used  Substance and Sexual Activity   Alcohol use: Yes    Comment: rare   Drug use: No   Sexual activity: Not on file

## 2023-06-01 ENCOUNTER — Telehealth: Payer: Self-pay

## 2023-06-01 NOTE — Telephone Encounter (Signed)
Note printed and placed in folder for [place].

## 2023-06-01 NOTE — Telephone Encounter (Signed)
Right knee instability as a diagnosis is applied.  The rest of the note will stay the same.  If they do not do the brace then they do not do the brace

## 2023-06-01 NOTE — Telephone Encounter (Signed)
Adam [name] with [facility] needing a different diagnosis code other than pain for the brace. Can you please addend note form 02/05?

## 2023-06-05 DIAGNOSIS — Z79899 Other long term (current) drug therapy: Secondary | ICD-10-CM | POA: Diagnosis not present

## 2023-06-05 DIAGNOSIS — M79671 Pain in right foot: Secondary | ICD-10-CM | POA: Diagnosis not present

## 2023-06-05 DIAGNOSIS — G8921 Chronic pain due to trauma: Secondary | ICD-10-CM | POA: Diagnosis not present

## 2023-06-05 DIAGNOSIS — Z5181 Encounter for therapeutic drug level monitoring: Secondary | ICD-10-CM | POA: Diagnosis not present

## 2023-06-05 DIAGNOSIS — Z9689 Presence of other specified functional implants: Secondary | ICD-10-CM | POA: Diagnosis not present

## 2023-06-08 DIAGNOSIS — M2351 Chronic instability of knee, right knee: Secondary | ICD-10-CM | POA: Diagnosis not present

## 2023-06-21 DIAGNOSIS — E039 Hypothyroidism, unspecified: Secondary | ICD-10-CM | POA: Diagnosis not present

## 2023-06-21 DIAGNOSIS — E1165 Type 2 diabetes mellitus with hyperglycemia: Secondary | ICD-10-CM | POA: Diagnosis not present

## 2023-06-21 DIAGNOSIS — I10 Essential (primary) hypertension: Secondary | ICD-10-CM | POA: Diagnosis not present

## 2023-06-21 DIAGNOSIS — J439 Emphysema, unspecified: Secondary | ICD-10-CM | POA: Diagnosis not present

## 2023-06-21 DIAGNOSIS — E7800 Pure hypercholesterolemia, unspecified: Secondary | ICD-10-CM | POA: Diagnosis not present

## 2023-06-21 DIAGNOSIS — K22719 Barrett's esophagus with dysplasia, unspecified: Secondary | ICD-10-CM | POA: Diagnosis not present

## 2023-07-03 DIAGNOSIS — M79671 Pain in right foot: Secondary | ICD-10-CM | POA: Diagnosis not present

## 2023-07-03 DIAGNOSIS — Z9689 Presence of other specified functional implants: Secondary | ICD-10-CM | POA: Diagnosis not present

## 2023-07-03 DIAGNOSIS — G8921 Chronic pain due to trauma: Secondary | ICD-10-CM | POA: Diagnosis not present

## 2023-08-09 DIAGNOSIS — E1165 Type 2 diabetes mellitus with hyperglycemia: Secondary | ICD-10-CM | POA: Diagnosis not present

## 2023-08-09 DIAGNOSIS — E039 Hypothyroidism, unspecified: Secondary | ICD-10-CM | POA: Diagnosis not present

## 2023-08-14 DIAGNOSIS — F17200 Nicotine dependence, unspecified, uncomplicated: Secondary | ICD-10-CM | POA: Diagnosis not present

## 2023-08-14 DIAGNOSIS — E1165 Type 2 diabetes mellitus with hyperglycemia: Secondary | ICD-10-CM | POA: Diagnosis not present

## 2023-08-14 DIAGNOSIS — E785 Hyperlipidemia, unspecified: Secondary | ICD-10-CM | POA: Diagnosis not present

## 2023-08-14 DIAGNOSIS — I10 Essential (primary) hypertension: Secondary | ICD-10-CM | POA: Diagnosis not present

## 2023-08-14 DIAGNOSIS — E039 Hypothyroidism, unspecified: Secondary | ICD-10-CM | POA: Diagnosis not present

## 2023-08-28 DIAGNOSIS — M79671 Pain in right foot: Secondary | ICD-10-CM | POA: Diagnosis not present

## 2023-08-28 DIAGNOSIS — G894 Chronic pain syndrome: Secondary | ICD-10-CM | POA: Diagnosis not present

## 2023-08-28 DIAGNOSIS — Z9689 Presence of other specified functional implants: Secondary | ICD-10-CM | POA: Diagnosis not present

## 2023-10-09 DIAGNOSIS — E1169 Type 2 diabetes mellitus with other specified complication: Secondary | ICD-10-CM | POA: Diagnosis not present

## 2023-10-09 DIAGNOSIS — E7800 Pure hypercholesterolemia, unspecified: Secondary | ICD-10-CM | POA: Diagnosis not present

## 2023-10-09 DIAGNOSIS — E1165 Type 2 diabetes mellitus with hyperglycemia: Secondary | ICD-10-CM | POA: Diagnosis not present

## 2023-10-09 DIAGNOSIS — J439 Emphysema, unspecified: Secondary | ICD-10-CM | POA: Diagnosis not present

## 2023-10-18 DIAGNOSIS — Z23 Encounter for immunization: Secondary | ICD-10-CM | POA: Diagnosis not present

## 2023-10-18 DIAGNOSIS — Z0000 Encounter for general adult medical examination without abnormal findings: Secondary | ICD-10-CM | POA: Diagnosis not present

## 2023-10-23 DIAGNOSIS — G894 Chronic pain syndrome: Secondary | ICD-10-CM | POA: Diagnosis not present

## 2023-10-23 DIAGNOSIS — M79671 Pain in right foot: Secondary | ICD-10-CM | POA: Diagnosis not present

## 2023-10-23 DIAGNOSIS — Z9689 Presence of other specified functional implants: Secondary | ICD-10-CM | POA: Diagnosis not present

## 2023-11-09 DIAGNOSIS — J439 Emphysema, unspecified: Secondary | ICD-10-CM | POA: Diagnosis not present

## 2023-11-09 DIAGNOSIS — E7800 Pure hypercholesterolemia, unspecified: Secondary | ICD-10-CM | POA: Diagnosis not present

## 2023-11-09 DIAGNOSIS — E1169 Type 2 diabetes mellitus with other specified complication: Secondary | ICD-10-CM | POA: Diagnosis not present

## 2023-11-09 DIAGNOSIS — E1165 Type 2 diabetes mellitus with hyperglycemia: Secondary | ICD-10-CM | POA: Diagnosis not present

## 2023-11-15 DIAGNOSIS — E1165 Type 2 diabetes mellitus with hyperglycemia: Secondary | ICD-10-CM | POA: Diagnosis not present

## 2023-11-15 DIAGNOSIS — E785 Hyperlipidemia, unspecified: Secondary | ICD-10-CM | POA: Diagnosis not present

## 2023-11-21 DIAGNOSIS — I10 Essential (primary) hypertension: Secondary | ICD-10-CM | POA: Diagnosis not present

## 2023-11-21 DIAGNOSIS — E1165 Type 2 diabetes mellitus with hyperglycemia: Secondary | ICD-10-CM | POA: Diagnosis not present

## 2023-11-21 DIAGNOSIS — L0293 Carbuncle, unspecified: Secondary | ICD-10-CM | POA: Diagnosis not present

## 2023-11-21 DIAGNOSIS — F17200 Nicotine dependence, unspecified, uncomplicated: Secondary | ICD-10-CM | POA: Diagnosis not present

## 2023-11-21 DIAGNOSIS — E785 Hyperlipidemia, unspecified: Secondary | ICD-10-CM | POA: Diagnosis not present

## 2023-11-21 DIAGNOSIS — E039 Hypothyroidism, unspecified: Secondary | ICD-10-CM | POA: Diagnosis not present

## 2023-12-10 DIAGNOSIS — E1169 Type 2 diabetes mellitus with other specified complication: Secondary | ICD-10-CM | POA: Diagnosis not present

## 2023-12-10 DIAGNOSIS — J439 Emphysema, unspecified: Secondary | ICD-10-CM | POA: Diagnosis not present

## 2023-12-10 DIAGNOSIS — E7800 Pure hypercholesterolemia, unspecified: Secondary | ICD-10-CM | POA: Diagnosis not present

## 2023-12-10 DIAGNOSIS — E1165 Type 2 diabetes mellitus with hyperglycemia: Secondary | ICD-10-CM | POA: Diagnosis not present

## 2023-12-28 DIAGNOSIS — L02214 Cutaneous abscess of groin: Secondary | ICD-10-CM | POA: Diagnosis not present

## 2023-12-28 DIAGNOSIS — L821 Other seborrheic keratosis: Secondary | ICD-10-CM | POA: Diagnosis not present

## 2024-01-02 DIAGNOSIS — E039 Hypothyroidism, unspecified: Secondary | ICD-10-CM | POA: Diagnosis not present

## 2024-01-02 DIAGNOSIS — E7800 Pure hypercholesterolemia, unspecified: Secondary | ICD-10-CM | POA: Diagnosis not present

## 2024-01-02 DIAGNOSIS — E1169 Type 2 diabetes mellitus with other specified complication: Secondary | ICD-10-CM | POA: Diagnosis not present

## 2024-01-02 DIAGNOSIS — E1165 Type 2 diabetes mellitus with hyperglycemia: Secondary | ICD-10-CM | POA: Diagnosis not present

## 2024-01-02 DIAGNOSIS — K219 Gastro-esophageal reflux disease without esophagitis: Secondary | ICD-10-CM | POA: Diagnosis not present

## 2024-01-02 DIAGNOSIS — F17210 Nicotine dependence, cigarettes, uncomplicated: Secondary | ICD-10-CM | POA: Diagnosis not present

## 2024-01-02 DIAGNOSIS — I10 Essential (primary) hypertension: Secondary | ICD-10-CM | POA: Diagnosis not present

## 2024-01-02 DIAGNOSIS — G894 Chronic pain syndrome: Secondary | ICD-10-CM | POA: Diagnosis not present

## 2024-01-08 DIAGNOSIS — Z79899 Other long term (current) drug therapy: Secondary | ICD-10-CM | POA: Diagnosis not present

## 2024-01-08 DIAGNOSIS — G894 Chronic pain syndrome: Secondary | ICD-10-CM | POA: Diagnosis not present

## 2024-01-08 DIAGNOSIS — M79671 Pain in right foot: Secondary | ICD-10-CM | POA: Diagnosis not present

## 2024-01-08 DIAGNOSIS — Z9689 Presence of other specified functional implants: Secondary | ICD-10-CM | POA: Diagnosis not present

## 2024-01-08 DIAGNOSIS — Z5181 Encounter for therapeutic drug level monitoring: Secondary | ICD-10-CM | POA: Diagnosis not present

## 2024-02-06 ENCOUNTER — Ambulatory Visit (INDEPENDENT_AMBULATORY_CARE_PROVIDER_SITE_OTHER)
Admission: RE | Admit: 2024-02-06 | Discharge: 2024-02-06 | Disposition: A | Discharge status: Home / Self Care | Source: Ambulatory Visit | Attending: Family Medicine | Admitting: Family Medicine

## 2024-02-06 DIAGNOSIS — Z122 Encounter for screening for malignant neoplasm of respiratory organs: Secondary | ICD-10-CM | POA: Diagnosis not present

## 2024-02-06 DIAGNOSIS — Z87891 Personal history of nicotine dependence: Secondary | ICD-10-CM

## 2024-02-06 DIAGNOSIS — F17210 Nicotine dependence, cigarettes, uncomplicated: Secondary | ICD-10-CM | POA: Diagnosis not present

## 2024-02-12 ENCOUNTER — Other Ambulatory Visit: Payer: Self-pay

## 2024-02-12 ENCOUNTER — Encounter: Payer: Self-pay | Admitting: Radiology

## 2024-02-12 DIAGNOSIS — Z122 Encounter for screening for malignant neoplasm of respiratory organs: Secondary | ICD-10-CM

## 2024-02-12 DIAGNOSIS — Z87891 Personal history of nicotine dependence: Secondary | ICD-10-CM
